# Patient Record
Sex: Female | Born: 1965
Health system: Southern US, Community
[De-identification: ages and names within clinical notes are randomized; demographics above are authoritative.]

## PROBLEM LIST (undated history)

## (undated) DIAGNOSIS — I10 Essential (primary) hypertension: Secondary | ICD-10-CM

## (undated) DIAGNOSIS — G43909 Migraine, unspecified, not intractable, without status migrainosus: Secondary | ICD-10-CM

## (undated) DIAGNOSIS — E079 Disorder of thyroid, unspecified: Secondary | ICD-10-CM

## (undated) HISTORY — PX: ABDOMINAL HYSTERECTOMY: SHX81

## (undated) HISTORY — PX: TUBAL LIGATION: SHX77

---

## 1999-10-08 ENCOUNTER — Other Ambulatory Visit: Admission: RE | Admit: 1999-10-08 | Discharge: 1999-10-08 | Payer: Self-pay | Admitting: Obstetrics and Gynecology

## 2002-08-19 ENCOUNTER — Emergency Department (HOSPITAL_COMMUNITY): Admission: EM | Admit: 2002-08-19 | Discharge: 2002-08-19 | Payer: Self-pay | Admitting: Emergency Medicine

## 2003-12-03 ENCOUNTER — Ambulatory Visit (HOSPITAL_COMMUNITY): Admission: RE | Admit: 2003-12-03 | Discharge: 2003-12-03 | Payer: Self-pay | Admitting: Obstetrics and Gynecology

## 2003-12-04 ENCOUNTER — Inpatient Hospital Stay (HOSPITAL_COMMUNITY): Admission: RE | Admit: 2003-12-04 | Discharge: 2003-12-06 | Payer: Self-pay | Admitting: Obstetrics and Gynecology

## 2003-12-04 ENCOUNTER — Encounter (INDEPENDENT_AMBULATORY_CARE_PROVIDER_SITE_OTHER): Payer: Self-pay | Admitting: Specialist

## 2010-05-30 ENCOUNTER — Emergency Department (HOSPITAL_COMMUNITY): Admission: EM | Admit: 2010-05-30 | Discharge: 2010-05-30 | Payer: Self-pay | Admitting: Emergency Medicine

## 2010-08-30 ENCOUNTER — Encounter: Payer: Self-pay | Admitting: Internal Medicine

## 2010-10-21 LAB — POCT URINALYSIS DIPSTICK
Glucose, UA: NEGATIVE mg/dL
Protein, ur: NEGATIVE mg/dL

## 2010-12-25 NOTE — Op Note (Signed)
NAME:  Janet Wolfe, Janet Wolfe                      ACCOUNT NO.:  [id number]   MEDICAL RECORD NO.:  [id number]                   PATIENT TYPE:  INP   LOCATION:  9304                                 FACILITY:  [facility]   PHYSICIAN:  Malachi Pro. Ambrose Mantle, M.D.              DATE OF BIRTH:  [birth year]   DATE OF PROCEDURE:  12/04/2003  DATE OF DISCHARGE:                                 OPERATIVE REPORT   PREOPERATIVE DIAGNOSES:  1. Menorrhagia and menometrorrhagia.  2. Fibroids.  3. Left ovarian cyst.   POSTOPERATIVE DIAGNOSES:  1. Menorrhagia and menometrorrhagia.  2. Fibroids.  3. Left ovarian cyst.   OPERATION:  1. Abdominal hysterectomy.  2. Incision of left ovarian cyst.   OPERATOR:  Malachi Pro. Ambrose Mantle, M.D.   ASSISTANT:  Zenaida Niece, M.D.   General anesthesia.   The patient was brought to the operating room, placed under satisfactory  general anesthesia.  She was placed in a frogleg position.  The abdomen was  prepped all the way to the xiphoid in case the midline incision was used and  needed to be extended.  The vulva and vagina and urethra were prepped and a  Foley catheter was inserted to straight drain.  Exam revealed the uterus to  be third degree retroverted, two times normal size, and the large left  ovarian cyst that had been palpable on the day prior to admission and been  confirmed by ultrasound showed regression, so I elected to do a transverse  incision.  The patient was placed supine, the abdomen was draped as a  sterile field, and a transverse incision was made suprapubically and carried  in layers through the skin, subcutaneous tissue, and fascia.  The fascia was  separated from the rectus muscle inferiorly and superiorly.  The midline was  identified, the peritoneum was opened, and the peritoneum was opened  vertically.  Exploration of the upper abdomen revealed the liver to be  smooth.  I did not feel the gallbladder.  Both kidneys felt normal.  Only  the lower  pole of the left kidney could be felt because the patient had  adhesions along the left paracolic gutter.  I do not know the origin of  these adhesions.  Inspection of the pelvis revealed the uterus to be two  times normal size with a large fibroid.  The uterus was markedly retroverted  but the cul-de-sac was free of disease.  Both tubes had a proximal segment  that had a small hydrosalpinx.  The distal segments of each tube looked  normal.  The right ovary was pretty much normal in appearance.  The left  ovary was still enlarged but had regressed markedly since its exam  yesterday.  There was no evidence of malignancy.  I incised the left ovarian  cyst.  It completely collapsed.  I felt the inside of the wall.  There was  no evidence of any abnormality on  the wall of the cyst.  This probably was  just a simple serous cyst of the ovary.  Packs and retractors were used to  prepare the operative field.  The upper pedicles of the uterus were clamped  across.  The round ligaments bilaterally were divided with the electrical  current.  The bladder flap was developed.  The upper pedicles were then  divided between clamps and doubly suture ligated.  The proximal portion of  each tube was carried with the uterus.  The parametrial tissues and uterine  vessels were clamped, cut, and suture ligated.  Parametrial and paracervical  tissues were handled in the same fashion.  The uterosacral ligaments were  clamped, cut, and suture ligated and held.  The left vaginal angle was  entered and then the right vaginal angle was entered.  The uterus was  removed by transecting the upper vagina.  Several interrupted figure-of-  eight sutures of 0 Vicryl were used to close the vagina after vaginal angle  sutures had been placed.  Liberal irrigation confirmed hemostasis.  The  uterosacral ligaments were sutured together in the midline.  While I was  trying to identify the left ureter, I think I may have dislodged a  suture  from the left ovary, and I had to suture this area with 3-0 Vicryl with good  hemostasis.  Liberal irrigation again confirmed hemostasis.  Both ureters  were palpated and were normal in their contour and size.  There was no  bleeding.  Reperitonealization was done across the vaginal cuff with  interrupted figure-of-eight sutures of 0 Vicryl.  Packs and retractors were  removed.  The peritoneum was closed with a running suture of 0 Vicryl,  rectus muscle with interrupted 0 Vicryl, fascia with two running sutures of  0 Vicryl, subcu with a running 3-0 Vicryl, and the skin was closed with  automatic staples.  The patient seemed to tolerate the procedure well.  Blood loss was estimated at no more than 200 mL.  Sponge and needle counts  were correct, and the patient was returned to recovery in satisfactory  condition.                                               Malachi Pro. Ambrose Mantle, M.D.    TFH/MEDQ  D:  12/04/2003  T:  12/04/2003  Job:  [id number]

## 2010-12-25 NOTE — H&P (Signed)
NAME:  Janet Wolfe, Janet Wolfe                      ACCOUNT NO.:  [id number]   MEDICAL RECORD NO.:  [id number]                   PATIENT TYPE:  INP   LOCATION:  NA                                   FACILITY:  [facility]   PHYSICIAN:  Malachi Pro. Ambrose Mantle, M.D.              DATE OF BIRTH:  07-23-1966   DATE OF ADMISSION:  12/04/2003  DATE OF DISCHARGE:                                HISTORY & PHYSICAL   PRESENT ILLNESS:  This is a 45 year old white female para 3-0-0-3 who was  admitted to the hospital for abdominal hysterectomy and possible bilateral  salpingo-oophorectomy because of severe menorrhagia, uterine enlargement and  bilateral adnexal masses.  This patient's last menstrual period began December 02, 2003; previous period was October 28, 2003, lasted for 10-14 days, she  passed egg-size clots; her previous period was September 30, 2003, lasted 14  days, egg-size clots.  She had no pain with her periods.  She has been  having no sexual activity.  She states that the last four to five cycles her  periods have been extremely heavy and prolonged.  Before that her periods  were always normal.  An endometrial biopsy on November 15, 2003 showed simple  endometrial hyperplasia without atypia, the uterus was about two times  normal size and she was advised to have hysterectomy.  She actually had come  asking for a hysterectomy.  On her preop exam December 03, 2003 the patient was  noted to have bilateral adnexal masses and an ultrasound showed bilateral  ovarian masses, complex cysts on both sides (left greater than right) and  there is a question of free fluid in the pelvis.  She also was noted to have  fibroids in the uterus.   PAST MEDICAL HISTORY:  1. Allergy to CODEINE in that it causes nausea and vomiting.  2. Medications:     a. Wellbutrin twice a day.     b. Amitriptyline.     c. Diovan/HCT.  3. Operations:  Tubal ligation.  4. No significant illnesses other than the high blood pressure.  No heart   problems.  No alcohol or tobacco.   REVIEW OF SYSTEMS:  Essentially negative except as in the present illness  plus migraines.   FAMILY HISTORY:  Mother 28 living and well.  Father 54 with high blood  pressure.  The patient has one sister with high blood pressure.  No  brothers.   SOCIAL HISTORY:  The patient works at [employer].  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   PHYSICAL EXAMINATION:  GENERAL:  Well-developed, well-nourished white female  182 pounds, blood pressure 130/94, pulse 82.  HEAD/EYES/EARS/NOSE AND THROAT:  No cranial abnormalities.  Extraocular  movements intact.  Nose and pharynx clear.  NECK:  Supple without thyromegaly.  HEART:  Normal size and sounds.  No murmurs.  LUNGS:  Clear to P&A.  BREASTS:  Soft, nontender.  No masses are palpable.  ABDOMEN:  Soft and nontender.  Liver, spleen and kidney are not felt.  No  masses.  GU:  Vulva and vagina heavy menstrual flow, the cervix is behind the  symphysis pubis, the uterus is third degree retroverted two times normal  size, bilateral adnexal masses are palpable.   ADMITTING IMPRESSION:  1. Menorrhagia.  2. Menometrorrhagia.  3. Leiomyomata uteri.  4. Bilateral adnexal masses.   PLAN:  Patient is admitted for abdominal hysterectomy, possible bilateral  salpingo-oophorectomy.  I have asked a general surgeon to stand by in case  the tumor is malignant.  The patient understands that it is possible that  both tubes and ovaries will need to be removed but if the tissue is benign  and it is possible to save one or both ovaries that is my plan.  She has  been cautioned about the risks of surgery including but not limited to heart  attack, stroke, pulmonary embolus, wound disruption, hemorrhage with need  for reoperation and/or transfusion, fistula formation, nerve injury, and  intestinal obstruction.  She understands and agrees to  proceed.                                               Malachi Pro. Ambrose Mantle, M.D.    TFH/MEDQ  D:  12/03/2003  T:  12/03/2003  Job:  [id number]

## 2010-12-25 NOTE — Discharge Summary (Signed)
NAME:  Janet Wolfe, Janet Wolfe                      ACCOUNT NO.:  [id number]   MEDICAL RECORD NO.:  [id number]                   PATIENT TYPE:  INP   LOCATION:  9304                                 FACILITY:  [facility]   PHYSICIAN:  Malachi Pro. Ambrose Mantle, M.D.              DATE OF BIRTH:  04-02-1966   DATE OF ADMISSION:  12/04/2003  DATE OF DISCHARGE:                                 DISCHARGE SUMMARY   HOSPITAL COURSE:  A 45 year old white female with menorrhagia,  metromenorrhagia, fibroids, and a left ovarian cyst admitted for abdominal  hysterectomy and possible bilateral salpingo-oophorectomy.  The patient  underwent abdominal hysterectomy and incision of the left ovarian cyst on  December 04, 2003.  There was no sign of malignancy.  The cyst appeared to be  somewhat simple in spite of the fact that the day prior to surgery the cyst  had appeared larger on ultrasound and actually appeared complex.  The  patient did well postoperatively and was discharged on postoperative day #2.  Her abdomen remained soft and nontender.  She tolerated a liquid diet  without difficulty, ambulated well, voided well, and was ready for discharge  on postoperative day #2.  Comprehensive metabolic profile was normal except  for a sodium of 134.  White count of 10,900; hemoglobin 12.4; hematocrit  37.1; platelet count 309,000; normal differential.  The patient was O  positive with a negative antibody.  Urinalysis was negative.  Follow-up  hematocrits were 32.9 and 29.8 and CA125 was 19.0.   FINAL DIAGNOSES WITH THE PATHOLOGY PENDING:  1. Menorrhagia and metromenorrhagia.  2. Leiomyomata uteri.  3. Simple left ovarian cyst.   OPERATION:  Abdominal hysterectomy, incision of left ovarian cyst.   FINAL CONDITION:  Improved.   INSTRUCTIONS:  Include our regular discharge instruction booklet, no vaginal  entrance, no heavy lifting or strenuous activity.  Call with any temperature  elevation greater than 100.4 degrees.  Call  with any heavy vaginal bleeding  or with any unusual problems.  The patient's incision is healing well,  staples are left in place, and she is advised to return to the office in 4  days to have her staples removed.  Mepergan Fortis #24 tablets one or two  q.4-6h. as needed for pain is given at discharge.                                               Malachi Pro. Ambrose Mantle, M.D.    TFH/MEDQ  D:  12/06/2003  T:  12/06/2003  Job:  [id number]

## 2011-05-31 ENCOUNTER — Other Ambulatory Visit: Payer: Self-pay | Admitting: Internal Medicine

## 2011-05-31 DIAGNOSIS — Z1231 Encounter for screening mammogram for malignant neoplasm of breast: Secondary | ICD-10-CM

## 2011-06-23 ENCOUNTER — Ambulatory Visit
Admission: RE | Admit: 2011-06-23 | Discharge: 2011-06-23 | Disposition: A | Discharge status: Home / Self Care | Payer: BC Managed Care – PPO | Source: Ambulatory Visit | Attending: Internal Medicine | Admitting: Internal Medicine

## 2011-06-23 DIAGNOSIS — Z1231 Encounter for screening mammogram for malignant neoplasm of breast: Secondary | ICD-10-CM

## 2011-08-19 ENCOUNTER — Other Ambulatory Visit: Payer: Self-pay | Admitting: Internal Medicine

## 2011-08-19 DIAGNOSIS — R945 Abnormal results of liver function studies: Secondary | ICD-10-CM

## 2011-10-26 ENCOUNTER — Other Ambulatory Visit: Payer: BC Managed Care – PPO

## 2011-10-29 ENCOUNTER — Other Ambulatory Visit: Payer: Self-pay | Admitting: Dermatology

## 2012-05-10 ENCOUNTER — Other Ambulatory Visit: Payer: Self-pay | Admitting: Internal Medicine

## 2012-10-24 ENCOUNTER — Other Ambulatory Visit: Payer: Self-pay

## 2012-10-24 DIAGNOSIS — Z1231 Encounter for screening mammogram for malignant neoplasm of breast: Secondary | ICD-10-CM

## 2012-11-24 ENCOUNTER — Ambulatory Visit
Admission: RE | Admit: 2012-11-24 | Discharge: 2012-11-24 | Disposition: A | Discharge status: Home / Self Care | Payer: No Typology Code available for payment source | Source: Ambulatory Visit

## 2012-11-24 ENCOUNTER — Ambulatory Visit: Payer: BC Managed Care – PPO

## 2012-11-24 DIAGNOSIS — Z1231 Encounter for screening mammogram for malignant neoplasm of breast: Secondary | ICD-10-CM

## 2013-10-22 ENCOUNTER — Other Ambulatory Visit: Payer: Self-pay

## 2013-10-22 DIAGNOSIS — Z1231 Encounter for screening mammogram for malignant neoplasm of breast: Secondary | ICD-10-CM

## 2013-11-15 ENCOUNTER — Other Ambulatory Visit: Payer: Self-pay | Admitting: Internal Medicine

## 2013-11-15 DIAGNOSIS — R7401 Elevation of levels of liver transaminase levels: Secondary | ICD-10-CM

## 2013-11-15 DIAGNOSIS — R740 Nonspecific elevation of levels of transaminase and lactic acid dehydrogenase [LDH]: Principal | ICD-10-CM

## 2013-11-19 ENCOUNTER — Ambulatory Visit
Admission: RE | Admit: 2013-11-19 | Discharge: 2013-11-19 | Disposition: A | Discharge status: Home / Self Care | Payer: No Typology Code available for payment source | Source: Ambulatory Visit | Attending: Internal Medicine | Admitting: Internal Medicine

## 2013-11-19 DIAGNOSIS — R740 Nonspecific elevation of levels of transaminase and lactic acid dehydrogenase [LDH]: Principal | ICD-10-CM

## 2013-11-19 DIAGNOSIS — R7401 Elevation of levels of liver transaminase levels: Secondary | ICD-10-CM

## 2013-11-27 ENCOUNTER — Ambulatory Visit: Payer: No Typology Code available for payment source

## 2013-11-30 ENCOUNTER — Ambulatory Visit
Admission: RE | Admit: 2013-11-30 | Discharge: 2013-11-30 | Disposition: A | Discharge status: Home / Self Care | Payer: No Typology Code available for payment source | Source: Ambulatory Visit

## 2013-11-30 ENCOUNTER — Encounter (INDEPENDENT_AMBULATORY_CARE_PROVIDER_SITE_OTHER): Payer: Self-pay

## 2013-11-30 DIAGNOSIS — Z1231 Encounter for screening mammogram for malignant neoplasm of breast: Secondary | ICD-10-CM

## 2014-01-08 ENCOUNTER — Other Ambulatory Visit: Payer: Self-pay | Admitting: Dermatology

## 2014-05-06 ENCOUNTER — Other Ambulatory Visit: Payer: Self-pay | Admitting: Internal Medicine

## 2014-05-06 DIAGNOSIS — N281 Cyst of kidney, acquired: Secondary | ICD-10-CM

## 2014-10-28 ENCOUNTER — Other Ambulatory Visit: Payer: Self-pay

## 2014-10-28 DIAGNOSIS — Z1239 Encounter for other screening for malignant neoplasm of breast: Secondary | ICD-10-CM

## 2014-11-14 ENCOUNTER — Other Ambulatory Visit: Payer: Self-pay | Admitting: Internal Medicine

## 2014-11-14 DIAGNOSIS — R945 Abnormal results of liver function studies: Secondary | ICD-10-CM

## 2014-11-21 ENCOUNTER — Ambulatory Visit
Admission: RE | Admit: 2014-11-21 | Discharge: 2014-11-21 | Disposition: A | Discharge status: Home / Self Care | Payer: BLUE CROSS/BLUE SHIELD | Source: Ambulatory Visit | Attending: Internal Medicine | Admitting: Internal Medicine

## 2014-11-21 DIAGNOSIS — R945 Abnormal results of liver function studies: Secondary | ICD-10-CM

## 2014-12-13 ENCOUNTER — Ambulatory Visit: Payer: BLUE CROSS/BLUE SHIELD

## 2014-12-13 ENCOUNTER — Ambulatory Visit: Payer: No Typology Code available for payment source

## 2014-12-18 ENCOUNTER — Ambulatory Visit
Admission: RE | Admit: 2014-12-18 | Discharge: 2014-12-18 | Disposition: A | Discharge status: Home / Self Care | Payer: BLUE CROSS/BLUE SHIELD | Source: Ambulatory Visit

## 2014-12-18 DIAGNOSIS — Z1239 Encounter for other screening for malignant neoplasm of breast: Secondary | ICD-10-CM

## 2015-11-13 ENCOUNTER — Other Ambulatory Visit: Payer: Self-pay

## 2015-11-13 DIAGNOSIS — Z1231 Encounter for screening mammogram for malignant neoplasm of breast: Secondary | ICD-10-CM

## 2016-01-20 ENCOUNTER — Ambulatory Visit: Payer: BLUE CROSS/BLUE SHIELD

## 2016-01-20 DIAGNOSIS — K580 Irritable bowel syndrome with diarrhea: Secondary | ICD-10-CM | POA: Diagnosis not present

## 2016-01-20 DIAGNOSIS — R110 Nausea: Secondary | ICD-10-CM | POA: Diagnosis not present

## 2016-01-20 DIAGNOSIS — E669 Obesity, unspecified: Secondary | ICD-10-CM | POA: Diagnosis not present

## 2016-01-26 ENCOUNTER — Ambulatory Visit
Admission: RE | Admit: 2016-01-26 | Discharge: 2016-01-26 | Disposition: A | Discharge status: Home / Self Care | Payer: BLUE CROSS/BLUE SHIELD | Source: Ambulatory Visit

## 2016-01-26 DIAGNOSIS — Z1231 Encounter for screening mammogram for malignant neoplasm of breast: Secondary | ICD-10-CM | POA: Diagnosis not present

## 2016-01-29 ENCOUNTER — Other Ambulatory Visit: Payer: Self-pay | Admitting: Internal Medicine

## 2016-01-29 DIAGNOSIS — R928 Other abnormal and inconclusive findings on diagnostic imaging of breast: Secondary | ICD-10-CM

## 2016-02-05 ENCOUNTER — Ambulatory Visit
Admission: RE | Admit: 2016-02-05 | Discharge: 2016-02-05 | Disposition: A | Discharge status: Home / Self Care | Payer: BLUE CROSS/BLUE SHIELD | Source: Ambulatory Visit | Attending: Internal Medicine | Admitting: Internal Medicine

## 2016-02-05 DIAGNOSIS — R928 Other abnormal and inconclusive findings on diagnostic imaging of breast: Secondary | ICD-10-CM | POA: Diagnosis not present

## 2016-03-17 DIAGNOSIS — Z0001 Encounter for general adult medical examination with abnormal findings: Secondary | ICD-10-CM | POA: Diagnosis not present

## 2016-03-17 DIAGNOSIS — Z13220 Encounter for screening for lipoid disorders: Secondary | ICD-10-CM | POA: Diagnosis not present

## 2016-03-17 DIAGNOSIS — Z0000 Encounter for general adult medical examination without abnormal findings: Secondary | ICD-10-CM | POA: Diagnosis not present

## 2016-03-24 DIAGNOSIS — R739 Hyperglycemia, unspecified: Secondary | ICD-10-CM | POA: Diagnosis not present

## 2016-03-24 DIAGNOSIS — E7800 Pure hypercholesterolemia, unspecified: Secondary | ICD-10-CM | POA: Diagnosis not present

## 2016-03-24 DIAGNOSIS — E039 Hypothyroidism, unspecified: Secondary | ICD-10-CM | POA: Diagnosis not present

## 2016-03-24 DIAGNOSIS — I10 Essential (primary) hypertension: Secondary | ICD-10-CM | POA: Diagnosis not present

## 2016-05-17 DIAGNOSIS — Z23 Encounter for immunization: Secondary | ICD-10-CM | POA: Diagnosis not present

## 2016-06-22 DIAGNOSIS — R739 Hyperglycemia, unspecified: Secondary | ICD-10-CM | POA: Diagnosis not present

## 2016-06-22 DIAGNOSIS — E039 Hypothyroidism, unspecified: Secondary | ICD-10-CM | POA: Diagnosis not present

## 2016-06-22 DIAGNOSIS — I10 Essential (primary) hypertension: Secondary | ICD-10-CM | POA: Diagnosis not present

## 2016-07-12 DIAGNOSIS — L918 Other hypertrophic disorders of the skin: Secondary | ICD-10-CM | POA: Diagnosis not present

## 2016-07-12 DIAGNOSIS — Z85828 Personal history of other malignant neoplasm of skin: Secondary | ICD-10-CM | POA: Diagnosis not present

## 2016-07-12 DIAGNOSIS — D2261 Melanocytic nevi of right upper limb, including shoulder: Secondary | ICD-10-CM | POA: Diagnosis not present

## 2016-07-12 DIAGNOSIS — D2262 Melanocytic nevi of left upper limb, including shoulder: Secondary | ICD-10-CM | POA: Diagnosis not present

## 2016-07-26 DIAGNOSIS — Z6833 Body mass index (BMI) 33.0-33.9, adult: Secondary | ICD-10-CM | POA: Diagnosis not present

## 2016-07-26 DIAGNOSIS — Z01419 Encounter for gynecological examination (general) (routine) without abnormal findings: Secondary | ICD-10-CM | POA: Diagnosis not present

## 2016-07-26 DIAGNOSIS — Z1151 Encounter for screening for human papillomavirus (HPV): Secondary | ICD-10-CM | POA: Diagnosis not present

## 2016-07-26 DIAGNOSIS — N390 Urinary tract infection, site not specified: Secondary | ICD-10-CM | POA: Diagnosis not present

## 2016-07-26 DIAGNOSIS — N3020 Other chronic cystitis without hematuria: Secondary | ICD-10-CM | POA: Diagnosis not present

## 2016-08-16 DIAGNOSIS — B070 Plantar wart: Secondary | ICD-10-CM | POA: Diagnosis not present

## 2016-08-16 DIAGNOSIS — F320 Major depressive disorder, single episode, mild: Secondary | ICD-10-CM | POA: Diagnosis not present

## 2016-08-16 DIAGNOSIS — I10 Essential (primary) hypertension: Secondary | ICD-10-CM | POA: Diagnosis not present

## 2016-09-20 DIAGNOSIS — R739 Hyperglycemia, unspecified: Secondary | ICD-10-CM | POA: Diagnosis not present

## 2016-09-20 DIAGNOSIS — I10 Essential (primary) hypertension: Secondary | ICD-10-CM | POA: Diagnosis not present

## 2016-09-27 DIAGNOSIS — F419 Anxiety disorder, unspecified: Secondary | ICD-10-CM | POA: Diagnosis not present

## 2016-09-27 DIAGNOSIS — E039 Hypothyroidism, unspecified: Secondary | ICD-10-CM | POA: Diagnosis not present

## 2016-09-27 DIAGNOSIS — I10 Essential (primary) hypertension: Secondary | ICD-10-CM | POA: Diagnosis not present

## 2016-09-27 DIAGNOSIS — R739 Hyperglycemia, unspecified: Secondary | ICD-10-CM | POA: Diagnosis not present

## 2017-02-10 ENCOUNTER — Other Ambulatory Visit: Payer: Self-pay | Admitting: Internal Medicine

## 2017-02-10 DIAGNOSIS — Z1231 Encounter for screening mammogram for malignant neoplasm of breast: Secondary | ICD-10-CM

## 2017-02-21 ENCOUNTER — Ambulatory Visit
Admission: RE | Admit: 2017-02-21 | Discharge: 2017-02-21 | Disposition: A | Discharge status: Home / Self Care | Payer: BLUE CROSS/BLUE SHIELD | Source: Ambulatory Visit | Attending: Internal Medicine | Admitting: Internal Medicine

## 2017-02-21 DIAGNOSIS — Z1231 Encounter for screening mammogram for malignant neoplasm of breast: Secondary | ICD-10-CM

## 2017-05-09 DIAGNOSIS — E039 Hypothyroidism, unspecified: Secondary | ICD-10-CM | POA: Diagnosis not present

## 2017-05-09 DIAGNOSIS — R739 Hyperglycemia, unspecified: Secondary | ICD-10-CM | POA: Diagnosis not present

## 2017-05-09 DIAGNOSIS — K7689 Other specified diseases of liver: Secondary | ICD-10-CM | POA: Diagnosis not present

## 2017-05-09 DIAGNOSIS — I10 Essential (primary) hypertension: Secondary | ICD-10-CM | POA: Diagnosis not present

## 2017-05-09 DIAGNOSIS — Z5181 Encounter for therapeutic drug level monitoring: Secondary | ICD-10-CM | POA: Diagnosis not present

## 2017-06-03 DIAGNOSIS — Z23 Encounter for immunization: Secondary | ICD-10-CM | POA: Diagnosis not present

## 2017-06-03 DIAGNOSIS — R739 Hyperglycemia, unspecified: Secondary | ICD-10-CM | POA: Diagnosis not present

## 2017-06-03 DIAGNOSIS — Z0000 Encounter for general adult medical examination without abnormal findings: Secondary | ICD-10-CM | POA: Diagnosis not present

## 2017-07-07 DIAGNOSIS — Z86010 Personal history of colonic polyps: Secondary | ICD-10-CM | POA: Diagnosis not present

## 2017-07-07 DIAGNOSIS — E669 Obesity, unspecified: Secondary | ICD-10-CM | POA: Diagnosis not present

## 2017-07-07 DIAGNOSIS — K580 Irritable bowel syndrome with diarrhea: Secondary | ICD-10-CM | POA: Diagnosis not present

## 2017-07-15 DIAGNOSIS — D2262 Melanocytic nevi of left upper limb, including shoulder: Secondary | ICD-10-CM | POA: Diagnosis not present

## 2017-07-15 DIAGNOSIS — Z85828 Personal history of other malignant neoplasm of skin: Secondary | ICD-10-CM | POA: Diagnosis not present

## 2017-07-15 DIAGNOSIS — D2261 Melanocytic nevi of right upper limb, including shoulder: Secondary | ICD-10-CM | POA: Diagnosis not present

## 2017-07-15 DIAGNOSIS — L821 Other seborrheic keratosis: Secondary | ICD-10-CM | POA: Diagnosis not present

## 2017-10-04 DIAGNOSIS — R197 Diarrhea, unspecified: Secondary | ICD-10-CM | POA: Diagnosis not present

## 2017-11-07 DIAGNOSIS — R739 Hyperglycemia, unspecified: Secondary | ICD-10-CM | POA: Diagnosis not present

## 2017-11-07 DIAGNOSIS — E7800 Pure hypercholesterolemia, unspecified: Secondary | ICD-10-CM | POA: Diagnosis not present

## 2017-11-14 DIAGNOSIS — E7800 Pure hypercholesterolemia, unspecified: Secondary | ICD-10-CM | POA: Diagnosis not present

## 2017-11-14 DIAGNOSIS — F5101 Primary insomnia: Secondary | ICD-10-CM | POA: Diagnosis not present

## 2017-12-14 DIAGNOSIS — G4700 Insomnia, unspecified: Secondary | ICD-10-CM | POA: Diagnosis not present

## 2018-01-18 DIAGNOSIS — E039 Hypothyroidism, unspecified: Secondary | ICD-10-CM | POA: Diagnosis not present

## 2018-01-18 DIAGNOSIS — I10 Essential (primary) hypertension: Secondary | ICD-10-CM | POA: Diagnosis not present

## 2018-01-18 DIAGNOSIS — G4700 Insomnia, unspecified: Secondary | ICD-10-CM | POA: Diagnosis not present

## 2018-01-18 DIAGNOSIS — F419 Anxiety disorder, unspecified: Secondary | ICD-10-CM | POA: Diagnosis not present

## 2018-01-19 ENCOUNTER — Other Ambulatory Visit: Payer: Self-pay | Admitting: Internal Medicine

## 2018-01-19 DIAGNOSIS — Z1231 Encounter for screening mammogram for malignant neoplasm of breast: Secondary | ICD-10-CM

## 2018-02-23 DIAGNOSIS — G43709 Chronic migraine without aura, not intractable, without status migrainosus: Secondary | ICD-10-CM | POA: Diagnosis not present

## 2018-03-15 ENCOUNTER — Other Ambulatory Visit: Payer: Self-pay | Admitting: Internal Medicine

## 2018-03-15 ENCOUNTER — Ambulatory Visit
Admission: RE | Admit: 2018-03-15 | Discharge: 2018-03-15 | Disposition: A | Discharge status: Home / Self Care | Payer: BLUE CROSS/BLUE SHIELD | Source: Ambulatory Visit | Attending: Internal Medicine | Admitting: Internal Medicine

## 2018-03-15 DIAGNOSIS — Z1231 Encounter for screening mammogram for malignant neoplasm of breast: Secondary | ICD-10-CM | POA: Diagnosis not present

## 2018-05-01 DIAGNOSIS — Z79899 Other long term (current) drug therapy: Secondary | ICD-10-CM | POA: Diagnosis not present

## 2018-07-21 DIAGNOSIS — L7211 Pilar cyst: Secondary | ICD-10-CM | POA: Diagnosis not present

## 2018-07-21 DIAGNOSIS — D2221 Melanocytic nevi of right ear and external auricular canal: Secondary | ICD-10-CM | POA: Diagnosis not present

## 2018-07-21 DIAGNOSIS — D2262 Melanocytic nevi of left upper limb, including shoulder: Secondary | ICD-10-CM | POA: Diagnosis not present

## 2018-07-21 DIAGNOSIS — Z85828 Personal history of other malignant neoplasm of skin: Secondary | ICD-10-CM | POA: Diagnosis not present

## 2018-07-24 DIAGNOSIS — Z79899 Other long term (current) drug therapy: Secondary | ICD-10-CM | POA: Diagnosis not present

## 2018-07-24 DIAGNOSIS — I10 Essential (primary) hypertension: Secondary | ICD-10-CM | POA: Diagnosis not present

## 2018-07-24 DIAGNOSIS — E039 Hypothyroidism, unspecified: Secondary | ICD-10-CM | POA: Diagnosis not present

## 2018-07-24 DIAGNOSIS — N390 Urinary tract infection, site not specified: Secondary | ICD-10-CM | POA: Diagnosis not present

## 2018-07-31 DIAGNOSIS — E7800 Pure hypercholesterolemia, unspecified: Secondary | ICD-10-CM | POA: Diagnosis not present

## 2018-07-31 DIAGNOSIS — Z0000 Encounter for general adult medical examination without abnormal findings: Secondary | ICD-10-CM | POA: Diagnosis not present

## 2018-07-31 DIAGNOSIS — G43909 Migraine, unspecified, not intractable, without status migrainosus: Secondary | ICD-10-CM | POA: Diagnosis not present

## 2018-07-31 DIAGNOSIS — I10 Essential (primary) hypertension: Secondary | ICD-10-CM | POA: Diagnosis not present

## 2018-07-31 DIAGNOSIS — Z23 Encounter for immunization: Secondary | ICD-10-CM | POA: Diagnosis not present

## 2018-08-31 DIAGNOSIS — K580 Irritable bowel syndrome with diarrhea: Secondary | ICD-10-CM | POA: Diagnosis not present

## 2018-08-31 DIAGNOSIS — Z86010 Personal history of colonic polyps: Secondary | ICD-10-CM | POA: Diagnosis not present

## 2018-11-21 DIAGNOSIS — F419 Anxiety disorder, unspecified: Secondary | ICD-10-CM | POA: Diagnosis not present

## 2018-11-21 DIAGNOSIS — G4700 Insomnia, unspecified: Secondary | ICD-10-CM | POA: Diagnosis not present

## 2018-11-21 DIAGNOSIS — F329 Major depressive disorder, single episode, unspecified: Secondary | ICD-10-CM | POA: Diagnosis not present

## 2018-11-21 DIAGNOSIS — I10 Essential (primary) hypertension: Secondary | ICD-10-CM | POA: Diagnosis not present

## 2018-12-19 DIAGNOSIS — F329 Major depressive disorder, single episode, unspecified: Secondary | ICD-10-CM | POA: Diagnosis not present

## 2018-12-19 DIAGNOSIS — I10 Essential (primary) hypertension: Secondary | ICD-10-CM | POA: Diagnosis not present

## 2019-02-05 DIAGNOSIS — G43909 Migraine, unspecified, not intractable, without status migrainosus: Secondary | ICD-10-CM | POA: Diagnosis not present

## 2019-02-05 DIAGNOSIS — F329 Major depressive disorder, single episode, unspecified: Secondary | ICD-10-CM | POA: Diagnosis not present

## 2019-02-05 DIAGNOSIS — I10 Essential (primary) hypertension: Secondary | ICD-10-CM | POA: Diagnosis not present

## 2019-02-06 ENCOUNTER — Other Ambulatory Visit: Payer: Self-pay | Admitting: Internal Medicine

## 2019-02-06 DIAGNOSIS — Z1231 Encounter for screening mammogram for malignant neoplasm of breast: Secondary | ICD-10-CM

## 2019-02-11 ENCOUNTER — Encounter (HOSPITAL_COMMUNITY): Payer: Self-pay

## 2019-02-11 ENCOUNTER — Other Ambulatory Visit: Payer: Self-pay

## 2019-02-11 ENCOUNTER — Emergency Department (HOSPITAL_COMMUNITY)
Admission: EM | Admit: 2019-02-11 | Discharge: 2019-02-11 | Disposition: A | Discharge status: Home / Self Care | Payer: BC Managed Care – PPO | Attending: Emergency Medicine | Admitting: Emergency Medicine

## 2019-02-11 DIAGNOSIS — F17220 Nicotine dependence, chewing tobacco, uncomplicated: Secondary | ICD-10-CM | POA: Insufficient documentation

## 2019-02-11 DIAGNOSIS — T148XXA Other injury of unspecified body region, initial encounter: Secondary | ICD-10-CM

## 2019-02-11 DIAGNOSIS — M545 Low back pain, unspecified: Secondary | ICD-10-CM

## 2019-02-11 DIAGNOSIS — R1032 Left lower quadrant pain: Secondary | ICD-10-CM | POA: Diagnosis not present

## 2019-02-11 DIAGNOSIS — I10 Essential (primary) hypertension: Secondary | ICD-10-CM | POA: Insufficient documentation

## 2019-02-11 DIAGNOSIS — S39012A Strain of muscle, fascia and tendon of lower back, initial encounter: Secondary | ICD-10-CM | POA: Diagnosis not present

## 2019-02-11 HISTORY — DX: Disorder of thyroid, unspecified: E07.9

## 2019-02-11 HISTORY — DX: Essential (primary) hypertension: I10

## 2019-02-11 HISTORY — DX: Migraine, unspecified, not intractable, without status migrainosus: G43.909

## 2019-02-11 LAB — URINALYSIS, COMPLETE (UACMP) WITH MICROSCOPIC
Bilirubin Urine: NEGATIVE
Glucose, UA: NEGATIVE mg/dL
Ketones, ur: NEGATIVE mg/dL
Nitrite: NEGATIVE
Protein, ur: NEGATIVE mg/dL
Specific Gravity, Urine: 1.023 (ref 1.005–1.030)
pH: 5 (ref 5.0–8.0)

## 2019-02-11 LAB — POC URINE PREG, ED: Preg Test, Ur: NEGATIVE

## 2019-02-11 MED ORDER — METHOCARBAMOL 500 MG PO TABS: 500.0000 mg | ORAL_TABLET | Freq: Two times a day (BID) | ORAL | 0 refills | Status: AC

## 2019-02-11 MED ORDER — NAPROXEN 375 MG PO TABS: 375.0000 mg | ORAL_TABLET | Freq: Two times a day (BID) | ORAL | 0 refills | Status: AC

## 2019-02-11 MED ORDER — LIDOCAINE 5 % EX PTCH: 1.0000 | MEDICATED_PATCH | CUTANEOUS | 0 refills | Status: AC

## 2019-02-11 MED ORDER — NAPROXEN 375 MG PO TABS
375.0000 mg | ORAL_TABLET | Freq: Once | ORAL | Status: AC
  Administered 2019-02-11: 18:00:00 375 mg via ORAL
  Filled 2019-02-11: qty 1

## 2019-02-11 MED ORDER — LIDOCAINE 5 % EX PTCH
1.0000 | MEDICATED_PATCH | CUTANEOUS | Status: DC
  Administered 2019-02-11: 1 via TRANSDERMAL
  Filled 2019-02-11: qty 1

## 2019-02-11 MED ORDER — METHOCARBAMOL 500 MG PO TABS
500.0000 mg | ORAL_TABLET | Freq: Once | ORAL | Status: AC
  Administered 2019-02-11: 500 mg via ORAL
  Filled 2019-02-11: qty 1

## 2019-02-11 NOTE — ED Triage Notes (Signed)
Pt c/o left lower back pain. Pt states when she bent over this morning, she started having pain. Pt stated that she can't get get into a comfortable position. No relief with advil PM.

## 2019-02-11 NOTE — ED Provider Notes (Addendum)
[facility]-EMERGENCY DEPT Provider Note   CSN: [id number] Arrival date & time: 02/11/19  1524    History   Chief Complaint Chief Complaint  Patient presents with  . Back Pain    HPI Janet BlazingMarilyn C Marner is a 53 y.o. female presented today with left lower back pain that began this morning after she leaned over to pick up her close after taking a shower.  Patient reports that when leaning over she had a sudden sharp sensation around her left gluteus medius muscle that has remained constant since onset she has taken 1 Advil without relief, moderate in intensity without radiation.  Patient reports that pain is worsened with palpation as well as leaning to the right side.  She denies any fall or injury.  Patient does report that she was lifting water buckets yesterday and is unsure if this has anything to do with her pain today.  Patient denies fever/chills, vomiting, diarrhea, dysuria/hematuria, vaginal bleeding/discharge, saddle area paresthesias, bowel/bladder incontinence, urinary retention, IV drug use, history of cancer, fall/injury or any additional concerns today.     HPI  Past Medical History:  Diagnosis Date  . Hypertension   . Migraine   . Thyroid disease     There are no active problems to display for this patient.   Past Surgical History:  Procedure Laterality Date  . ABDOMINAL HYSTERECTOMY       OB History   No obstetric history on file.      Home Medications    Prior to Admission medications   Medication Sig Start Date End Date Taking? Authorizing Provider  lidocaine (LIDODERM) 5 % Place 1 patch onto the skin daily. Remove & Discard patch within 12 hours or as directed by MD 02/11/19   Bill SalinasMorelli, Haydyn Girvan A, PA-C  methocarbamol (ROBAXIN) 500 MG tablet Take 1 tablet (500 mg total) by mouth 2 (two) times daily for 7 days. 02/11/19 02/18/19  Harlene SaltsMorelli, Jihan Mellette A, PA-C  naproxen (NAPROSYN) 375 MG tablet Take 1 tablet (375 mg total) by mouth 2 (two)  times daily for 7 days. 02/11/19 02/18/19  Bill SalinasMorelli, Fallynn Gravett A, PA-C    Family History Family History  Problem Relation Age of Onset  . Breast cancer Maternal Grandmother     Social History Social History   Tobacco Use  . Smoking status: Former Games developermoker  . Smokeless tobacco: Current User  Substance Use Topics  . Alcohol use: Never    Frequency: Never  . Drug use: Never     Allergies   Patient has no known allergies.   Review of Systems Review of Systems  Constitutional: Negative.  Negative for chills and fever.  Respiratory: Negative.  Negative for cough and shortness of breath.   Cardiovascular: Negative.  Negative for chest pain.  Gastrointestinal: Negative.  Negative for abdominal pain, blood in stool, constipation, diarrhea and vomiting.  Genitourinary: Negative.  Negative for difficulty urinating, dysuria, frequency, hematuria, pelvic pain, vaginal bleeding and vaginal discharge.  Musculoskeletal: Positive for back pain. Negative for neck pain.  Neurological: Negative.  Negative for weakness, numbness and headaches.       Denies saddle area paresthesias Denies bowel/bladder incontinence Denies urinary retention   Physical Exam Updated Vital Signs BP (!) 146/85 (BP Location: Left Arm)   Pulse 75   Temp 99 F (37.2 C) (Oral)   Resp 16   Ht 5\' 4"  (1.626 m)   Wt 72.6 kg   SpO2 100%   BMI 27.46 kg/m   Physical Exam Constitutional:  General: She is not in acute distress.    Appearance: Normal appearance. She is obese. She is not ill-appearing or diaphoretic.  HENT:     Head: Normocephalic and atraumatic. No raccoon eyes or Battle's sign.     Jaw: There is normal jaw occlusion. No trismus.     Right Ear: External ear normal.     Left Ear: External ear normal.     Nose: Nose normal.     Mouth/Throat:     Lips: Pink.     Mouth: Mucous membranes are moist.     Pharynx: Oropharynx is clear.  Eyes:     General: Vision grossly intact. Gaze aligned  appropriately.     Extraocular Movements: Extraocular movements intact.     Conjunctiva/sclera: Conjunctivae normal.     Pupils: Pupils are equal, round, and reactive to light.  Neck:     Musculoskeletal: Full passive range of motion without pain, normal range of motion and neck supple. No neck rigidity.     Trachea: Trachea and phonation normal. No tracheal tenderness or tracheal deviation.  Cardiovascular:     Rate and Rhythm: Normal rate and regular rhythm.     Pulses:          Radial pulses are 2+ on the right side and 2+ on the left side.       Dorsalis pedis pulses are 2+ on the right side and 2+ on the left side.  Pulmonary:     Effort: Pulmonary effort is normal. No respiratory distress.  Abdominal:     General: Bowel sounds are normal. There is no distension.     Palpations: Abdomen is soft. There is no pulsatile mass.     Tenderness: There is no abdominal tenderness. There is no guarding or rebound.  Genitourinary:    Comments: Exam deferred by patient Musculoskeletal:       Back:     Comments: No midline C/T/L spinal tenderness to palpation no deformity, crepitus, or step-off noted. No sign of injury to the neck or back.  Reproducible tenderness to palpation of the left gluteus muscles. - Positive left straight leg raise   Feet:     Right foot:     Protective Sensation: 5 sites tested. 5 sites sensed.     Left foot:     Protective Sensation: 5 sites tested. 5 sites sensed.  Skin:    General: Skin is warm and dry.     Capillary Refill: Capillary refill takes less than 2 seconds.  Neurological:     Mental Status: She is alert.     GCS: GCS eye subscore is 4. GCS verbal subscore is 5. GCS motor subscore is 6.     Comments: Speech is clear and goal oriented, follows commands Major Cranial nerves without deficit, no facial droop Normal strength in upper and lower extremities bilaterally including dorsiflexion and plantar flexion, strong and equal grip strength  Sensation normal to light and sharp touch Moves extremities without ataxia, coordination intact Normal gait DTR 2+ bilateral patella, no clonus of the feet  Psychiatric:        Behavior: Behavior is cooperative.    ED Treatments / Results  Labs (all labs ordered are listed, but only abnormal results are displayed) Labs Reviewed  URINALYSIS, COMPLETE (UACMP) WITH MICROSCOPIC - Abnormal; Notable for the following components:      Result Value   APPearance HAZY (*)    Hgb urine dipstick SMALL (*)    Leukocytes,Ua TRACE (*)  Bacteria, UA RARE (*)    All other components within normal limits  URINE CULTURE  POC URINE PREG, ED    EKG None  Radiology No results found.  Procedures Procedures (including critical care time)  Medications Ordered in ED Medications  lidocaine (LIDODERM) 5 % 1 patch (1 patch Transdermal Patch Applied 02/11/19 1821)  naproxen (NAPROSYN) tablet 375 mg (375 mg Oral Given 02/11/19 1821)  methocarbamol (ROBAXIN) tablet 500 mg (500 mg Oral Given 02/11/19 1821)     Initial Impression / Assessment and Plan / ED Course  I have reviewed the triage vital signs and the nursing notes.  Pertinent labs & imaging results that were available during my care of the patient were reviewed by me and considered in my medical decision making (see chart for details).    Janet Wolfe is a 53 y.o. female presenting with left sided lower back pain that began after leaning over to pick up some close this morning. Patient denies history of trauma, fever, IV drug use, night sweats, weight loss, cancer, saddle anesthesia, urinary rentention, bowel/bladder incontinence. No neurological deficits and normal neuro exam.  Pain is consistently reproducible to palpation of the left gluteus medius muscle; suspect musculoskeletal etiology of patient's pain. Abdomen soft/nontender and without pulsatile mass. Patient with equal pedal pulses. Doubt spinal epidural abscess, cauda equina or  AAA.  Imaging not indicated at this time.   Urinalysis ordered by triage staff shows trace leukocytes, 6-10 white blood cells and rare bacteria, nitrite negative. Patient is without urinary symptoms, doubt urinary tract infection, kidney stone or pyelonephritis, will order urine culture, no indication for antibiotics at this time as she is asymptomatic regarding this.  Will send for culture. Discussed with Dr. Lynelle DoctorKnapp.  Urine pregnancy negative  Patient is ambulatory in the emergency department without assistance. RICE protocol and pain medicine indicated and discussed with patient.   Naproxen 500mg  BID prescribed. Patient denies history of CKD or gastric ulcers/bleeding. Robaxin 500mg  BID prescribed. Patient informed to avoid driving or operating heavy machinery while taking muscle relaxer. Lidoderm patch prescribed. - At this time there does not appear to be any evidence of an acute emergency medical condition and the patient appears stable for discharge with appropriate outpatient follow up. Diagnosis was discussed with patient who verbalizes understanding of care plan and is agreeable to discharge. I have discussed return precautions with patient who verbalizes understanding of return precautions. Patient encouraged to follow-up with their PCP. All questions answered.  Patient has been discharged in good condition.   Note: Portions of this report may have been transcribed using voice recognition software. Every effort was made to ensure accuracy; however, inadvertent computerized transcription errors may still be present. Final Clinical Impressions(s) / ED Diagnoses   Final diagnoses:  Acute left-sided low back pain without sciatica  Muscle strain    ED Discharge Orders         Ordered    methocarbamol (ROBAXIN) 500 MG tablet  2 times daily     02/11/19 1820    naproxen (NAPROSYN) 375 MG tablet  2 times daily     02/11/19 1820    lidocaine (LIDODERM) 5 %  Every 24 hours     02/11/19  1820           Bill SalinasMorelli, Zyan Coby A, PA-C 02/11/19 1823    Elizabeth PalauMorelli, Angelos Wasco A, PA-C 02/11/19 Vashti [id number]    Knapp, Jon, MD 02/12/19 1322

## 2019-02-11 NOTE — Discharge Instructions (Addendum)
You have been diagnosed today with muscle strain with left lower back pain.  At this time there does not appear to be the presence of an emergent medical condition, however there is always the potential for conditions to change. Please read and follow the below instructions.  Please return to the Emergency Department immediately for any new or worsening symptoms. Please be sure to follow up with your Primary Care Provider within one week regarding your visit today; please call their office to schedule an appointment even if you are feeling better for a follow-up visit. You may take the muscle relaxer Robaxin as prescribed to help with your symptoms.  Do not drive or operate heavy machinery while taking Robaxin as will make you drowsy.  Do not drink alcohol or take other sedating medications with Robaxin as this will worsen side effects. You may use the medication naproxen as prescribed to help with your symptoms.  Please drink plenty of water while taking this medication.  Take this medication with food.  Do not take other NSAID-containing medications including ibuprofen, Advil or Aleve with this medication. You may use the Lidoderm patches as prescribed to help with your symptoms. Your urine was sent for culture today, if this grows out bacteria that will need antibiotic treatment you will be contacted by nursing staff regarding this.  You may check your MyChart account for results in 2-3 days.  Get help right away if: You develop new bowel or bladder control problems. You have unusual weakness or numbness in your arms or legs. You develop nausea or vomiting. You develop abdominal pain. You feel faint. You have fever or chills You have pain when you pee or blood in your urine. Any new/concerning or worsening symptoms  Please read the additional information packets attached to your discharge summary.  Do not take your medicine if  develop an itchy rash, swelling in your mouth or lips, or  difficulty breathing; call 911 and seek immediate emergency medical attention if this occurs.

## 2019-02-13 LAB — URINE CULTURE

## 2019-02-14 DIAGNOSIS — M545 Low back pain: Secondary | ICD-10-CM | POA: Diagnosis not present

## 2019-02-20 DIAGNOSIS — M545 Low back pain: Secondary | ICD-10-CM | POA: Diagnosis not present

## 2019-02-20 DIAGNOSIS — I10 Essential (primary) hypertension: Secondary | ICD-10-CM | POA: Diagnosis not present

## 2019-03-13 DIAGNOSIS — M549 Dorsalgia, unspecified: Secondary | ICD-10-CM | POA: Diagnosis not present

## 2019-03-13 DIAGNOSIS — K760 Fatty (change of) liver, not elsewhere classified: Secondary | ICD-10-CM | POA: Diagnosis not present

## 2019-03-13 DIAGNOSIS — E669 Obesity, unspecified: Secondary | ICD-10-CM | POA: Diagnosis not present

## 2019-03-13 DIAGNOSIS — R1032 Left lower quadrant pain: Secondary | ICD-10-CM | POA: Diagnosis not present

## 2019-04-02 ENCOUNTER — Other Ambulatory Visit: Payer: Self-pay

## 2019-04-02 ENCOUNTER — Encounter: Payer: Self-pay | Admitting: Rheumatology

## 2019-04-02 ENCOUNTER — Ambulatory Visit: Payer: Self-pay

## 2019-04-02 ENCOUNTER — Ambulatory Visit: Payer: BC Managed Care – PPO | Admitting: Rheumatology

## 2019-04-02 VITALS — BP 110/76 | HR 79 | Resp 13 | Ht 64.0 in | Wt 200.2 lb

## 2019-04-02 DIAGNOSIS — K760 Fatty (change of) liver, not elsewhere classified: Secondary | ICD-10-CM | POA: Diagnosis not present

## 2019-04-02 DIAGNOSIS — Z8639 Personal history of other endocrine, nutritional and metabolic disease: Secondary | ICD-10-CM

## 2019-04-02 DIAGNOSIS — Z8719 Personal history of other diseases of the digestive system: Secondary | ICD-10-CM | POA: Diagnosis not present

## 2019-04-02 DIAGNOSIS — M533 Sacrococcygeal disorders, not elsewhere classified: Secondary | ICD-10-CM | POA: Diagnosis not present

## 2019-04-02 DIAGNOSIS — G8929 Other chronic pain: Secondary | ICD-10-CM

## 2019-04-02 DIAGNOSIS — Z86010 Personal history of colonic polyps: Secondary | ICD-10-CM

## 2019-04-02 DIAGNOSIS — Z8669 Personal history of other diseases of the nervous system and sense organs: Secondary | ICD-10-CM

## 2019-04-02 DIAGNOSIS — Z8659 Personal history of other mental and behavioral disorders: Secondary | ICD-10-CM

## 2019-04-02 DIAGNOSIS — M5440 Lumbago with sciatica, unspecified side: Secondary | ICD-10-CM

## 2019-04-02 DIAGNOSIS — G4709 Other insomnia: Secondary | ICD-10-CM

## 2019-04-02 NOTE — Progress Notes (Signed)
Office Visit Note  Patient: Janet BlazingMarilyn C Belter             Date of Birth: March 04, 1966           MRN: [id number]             PCP: Pearson GrippeKim, James, MD Referring: Charna ElizabethMann, Jyothi, MD Visit Date: 04/02/2019 Occupation: Receptionist at [employer]  Subjective:  Lower back pain.   History of Present Illness: Janet Wolfe is a 53 y.o. female seen in consultation per request of Dr. Loreta AveMann for evaluation of her left lower back pain.  According to patient about 2 months ago she came out of the shower and pulling up her clothes when she experienced sudden sharp pain in the left lower side of her back which radiated to her abdominal area.  She states she rested for a few hours and then she tried to get up and the pain was very severe to the point that she had to go to the emergency room.  She states there she had evaluation and was given prednisone taper and meloxicam.  She states the prednisone taper helped her and then after she finished prednisone she took meloxicam which did not help her at all.  She states she followed up with her PCP who recommended MRI of her lumbar spine.  She states the co-pay was very high and she postpone the MRI till December 2020.  She continues to have lower back pain radiating to the abdominal region which she describes to the left lower quadrant.  She states she was given Robaxin which is not helping at all.  She was also evaluated by Dr. Quincy SheehanMeehan and her GI work-up was negative.  Activities of Daily Living:  Patient reports morning stiffness for 1 hour.   Patient Denies nocturnal pain.  Difficulty dressing/grooming: Denies Difficulty climbing stairs: Reports Difficulty getting out of chair: Reports Difficulty using hands for taps, buttons, cutlery, and/or writing: Denies  Review of Systems  Constitutional: Positive for fatigue. Negative for night sweats, weight gain and weight loss.  HENT: Negative for mouth sores, trouble swallowing, trouble swallowing, mouth dryness  and nose dryness.   Eyes: Negative for pain, redness, visual disturbance and dryness.  Respiratory: Negative for cough, shortness of breath and difficulty breathing.   Cardiovascular: Negative for chest pain, palpitations, hypertension, irregular heartbeat and swelling in legs/feet.  Gastrointestinal: Negative for blood in stool, constipation and diarrhea.  Endocrine: Negative for increased urination.  Genitourinary: Negative for vaginal dryness.  Musculoskeletal: Positive for arthralgias, joint pain, myalgias, morning stiffness and myalgias. Negative for joint swelling, muscle weakness and muscle tenderness.  Skin: Negative for color change, rash, hair loss, skin tightness, ulcers and sensitivity to sunlight.  Allergic/Immunologic: Negative for susceptible to infections.  Neurological: Negative for dizziness, memory loss, night sweats and weakness.  Hematological: Negative for swollen glands.  Psychiatric/Behavioral: Positive for depressed mood and sleep disturbance. The patient is nervous/anxious.     PMFS History:  There are no active problems to display for this patient.   Past Medical History:  Diagnosis Date   Hypertension    Migraine    Thyroid disease     Family History  Problem Relation Age of Onset   Dementia Father    Depression Sister    Migraines Sister    Healthy Son    Healthy Daughter    Healthy Daughter    Past Surgical History:  Procedure Laterality Date   ABDOMINAL HYSTERECTOMY     TUBAL  LIGATION     Social History   Social History Narrative   Not on file    There is no immunization history on file for this patient.   Objective: Vital Signs: BP 110/76 (BP Location: Right Arm, Patient Position: Sitting, Cuff Size: Normal)    Pulse 79    Resp 13    Ht 5\' 4"  (1.626 m)    Wt 200 lb 3.2 oz (90.8 kg)    BMI 34.36 kg/m    Physical Exam Vitals signs and nursing note reviewed.  Constitutional:      Appearance: She is well-developed.  HENT:       Head: Normocephalic and atraumatic.  Eyes:     Conjunctiva/sclera: Conjunctivae normal.  Neck:     Musculoskeletal: Normal range of motion.  Cardiovascular:     Rate and Rhythm: Normal rate and regular rhythm.     Heart sounds: Normal heart sounds.  Pulmonary:     Effort: Pulmonary effort is normal.     Breath sounds: Normal breath sounds.  Abdominal:     General: Bowel sounds are normal.     Palpations: Abdomen is soft.  Lymphadenopathy:     Cervical: No cervical adenopathy.  Skin:    General: Skin is warm and dry.     Capillary Refill: Capillary refill takes less than 2 seconds.  Neurological:     Mental Status: She is alert and oriented to person, place, and time.  Psychiatric:        Behavior: Behavior normal.      Musculoskeletal Exam: C-spine thoracic and lumbar spine with a fairly good range of motion.  She has some tenderness on palpation of her lower lumbar region.  She also had tenderness on palpation of her left SI joint.  Shoulder joints, elbow joints, wrist joints, MCPs, PIPs and DIPs with good range of motion with no synovitis.  Hip joints, knee joints, ankles, MTPs and PIPs with good range of motion with no synovitis.  CDAI Exam: CDAI Score: -- Patient Global: --; Provider Global: -- Swollen: --; Tender: -- Joint Exam   No joint exam has been documented for this visit   There is currently no information documented on the homunculus. Go to the Rheumatology activity and complete the homunculus joint exam.  Investigation: No additional findings.  Imaging: No results found.  Recent Labs: No results found for: WBC, HGB, PLT, NA, K, CL, CO2, GLUCOSE, BUN, CREATININE, BILITOT, ALKPHOS, AST, ALT, PROT, ALBUMIN, CALCIUM, GFRAA, QFTBGOLD, QFTBGOLDPLUS   02/21/2018 CBC normal, UA positive for bacterial infection, CMP normal, lipid panel LDL 106, TSH normal, hemoglobin A1c 5.3 Speciality Comments: No specialty comments available.  Procedures:  No procedures  performed Allergies: Patient has no known allergies.   Assessment / Plan:     Visit Diagnoses: Chronic left-sided low back pain with sciatica, sciatica laterality unspecified -patient has been experiencing left-sided back pain radiating to the abdominal region for the last 2 months.  She states the pain started suddenly when she got up from bending over.  She had good response to prednisone taper in the beginning but the pain persist despite taking NSAIDs and muscle relaxers.  She could not afford the MRI and it was not performed.  Plan: XR Lumbar Spine 2-3 Views x-ray of the lumbar spine was consistent with scoliosis, degenerative changes and facet joint arthropathy.  She would benefit from physical therapy.  I have given her a handout on back exercises.  I have advised her to contact me  in case her symptoms do not improve.  Otherwise I will see her back in 3 months.  Chronic left SI joint pain -she also has some tenderness over left SI joint.  Plan: XR Pelvis 1-2 Views.  The SI joint x-rays were consistent with degenerative changes.  Other medical problems are listed as follows:  Fatty infiltration of liver  History of IBS  Hx of colonic polyps  Hx of migraines  Other insomnia  History of anxiety  History of hypothyroidism  Orders: Orders Placed This Encounter  Procedures   XR Lumbar Spine 2-3 Views   XR Pelvis 1-2 Views   No orders of the defined types were placed in this encounter.   Face-to-face time spent with patient was 45 minutes. Greater than 50% of time was spent in counseling and coordination of care.  Follow-Up Instructions: Return for LBP.   Pollyann SavoyShaili Kinaya Hilliker, MD  Note - This record has been created using Animal [employer]Dragon software.  Chart creation errors have been sought, but may not always  have been located. Such creation errors do not reflect on  the standard of medical care.

## 2019-04-02 NOTE — Patient Instructions (Signed)

## 2019-04-17 ENCOUNTER — Ambulatory Visit: Payer: BLUE CROSS/BLUE SHIELD

## 2019-04-17 NOTE — Progress Notes (Signed)
Office Visit Note  Patient: Janet Wolfe             Date of Birth: [birth year]           MRN: [id number]             PCP: Pearson GrippeKim, James, MD Referring: Pearson GrippeKim, James, MD Visit Date: 05/01/2019 Occupation: @GUARO[employer] @  Subjective:  Left sided lower back pain   History of Present Illness: Janet Wolfe is a 53 y.o. female with history of DDD and left SI joint pain.  She continues to have persistent lower back pain that started on 02/11/2019.  She denies any symptoms of radiculopathy. She denies any numbness or weakness.  She has occasional left SI joint pain.  She says she started performing back exercises at home but has not scheduled physical therapy yet.  She is planning on scheduling physical therapy around her work.  She has been taking Robaxin 500 mg 2 tablets 3 times daily PRN for muscle spasms.  She has tried Lidoderm patches in the past which were ineffective.  She states that the pain is constant and experiences nocturnal pain.  She denies any other joint pain or joint swelling at this time.  She has no other new concerns.   Activities of Daily Living:  Patient reports morning stiffness for 1 hour.   Patient Reports nocturnal pain.  Difficulty dressing/grooming: Reports Difficulty climbing stairs: Reports Difficulty getting out of chair: Denies Difficulty using hands for taps, buttons, cutlery, and/or writing: Denies  Review of Systems  Constitutional: Negative for fatigue.  HENT: Negative for mouth sores, mouth dryness and nose dryness.   Eyes: Negative for itching and dryness.  Respiratory: Negative for shortness of breath, wheezing and difficulty breathing.   Cardiovascular: Negative for chest pain and palpitations.  Gastrointestinal: Negative for blood in stool, constipation and diarrhea.  Endocrine: Negative for increased urination.  Genitourinary: Negative for difficulty urinating and painful urination.  Musculoskeletal: Positive for arthralgias, joint pain and morning  stiffness. Negative for joint swelling.  Skin: Negative for rash and hair loss.  Allergic/Immunologic: Negative for susceptible to infections.  Neurological: Negative for dizziness, light-headedness, headaches, memory loss and weakness.  Hematological: Negative for bruising/bleeding tendency.  Psychiatric/Behavioral: Negative for confusion and sleep disturbance.    PMFS History:  There are no active problems to display for this patient.   Past Medical History:  Diagnosis Date  . Hypertension   . Migraine   . Thyroid disease     Family History  Problem Relation Age of Onset  . Dementia Father   . Depression Sister   . Migraines Sister   . Healthy Son   . Healthy Daughter   . Healthy Daughter    Past Surgical History:  Procedure Laterality Date  . ABDOMINAL HYSTERECTOMY    . TUBAL LIGATION     Social History   Social History Narrative  . Not on file    There is no immunization history on file for this patient.   Objective: Vital Signs: BP 120/75 (BP Location: Left Arm, Patient Position: Sitting, Cuff Size: Normal)   Pulse 73   Resp 15   Ht 5\' 4"  (1.626 m)   Wt 199 lb (90.3 kg)   BMI 34.16 kg/m    Physical Exam Vitals signs and nursing note reviewed.  Constitutional:      Appearance: She is well-developed.  HENT:     Head: Normocephalic and atraumatic.  Eyes:     Conjunctiva/sclera: Conjunctivae normal.  Neck:     Musculoskeletal: Normal range of motion.  Cardiovascular:     Rate and Rhythm: Normal rate and regular rhythm.     Heart sounds: Normal heart sounds.  Pulmonary:     Effort: Pulmonary effort is normal.     Breath sounds: Normal breath sounds.  Abdominal:     General: Bowel sounds are normal.     Palpations: Abdomen is soft.  Lymphadenopathy:     Cervical: No cervical adenopathy.  Skin:    General: Skin is warm and dry.     Capillary Refill: Capillary refill takes less than 2 seconds.  Neurological:     Mental Status: She is alert and  oriented to person, place, and time.  Psychiatric:        Behavior: Behavior normal.      Musculoskeletal Exam: C-spine, thoracic spine, lumbar spine good range of motion.  She has tenderness in the left paraspinal muscles in the lumbar region.  She has mild left SI joint tenderness.  Shoulder joints, elbow joints, wrist joints, MCPs, PIPs, DIPs good range of motion no synovitis.  She has complete fist formation bilaterally.  Hip joints, knee joints, ankle joints, MTPs, PIPs and DIPs good range of motion with no synovitis.  No warmth or effusion of bilateral knee joints.  No tenderness or swelling of ankle joints.  CDAI Exam: CDAI Score: - Patient Global: -; Provider Global: - Swollen: -; Tender: - Joint Exam   No joint exam has been documented for this visit   There is currently no information documented on the homunculus. Go to the Rheumatology activity and complete the homunculus joint exam.  Investigation: No additional findings.  Imaging: Xr Lumbar Spine 2-3 Views  Result Date: 04/02/2019 Dextroscoliosis was noted.  No significant disc space narrowing was noted.  Facet joint arthropathy was noted.  Anterior spurring was noted.  Narrowing between T11 and T12 was noted. Impression: These findings are consistent with dextroscoliosis, mild degenerative disc disease and facet joint arthropathy.  Xr Pelvis 1-2 Views  Result Date: 04/02/2019 Some degenerative changes were noted in the right SI joint.  No SI joint sclerosis was noted. Impression: Mild osteoarthritic changes were noted in the SI joints.   Recent Labs: No results found for: WBC, HGB, PLT, NA, K, CL, CO2, GLUCOSE, BUN, CREATININE, BILITOT, ALKPHOS, AST, ALT, PROT, ALBUMIN, CALCIUM, GFRAA, QFTBGOLD, QFTBGOLDPLUS  Speciality Comments: No specialty comments available.  Procedures:  No procedures performed Allergies: Patient has no known allergies.   Assessment / Plan:     Visit Diagnoses: DDD (degenerative disc  disease), lumbar - X-ray showed a scoliosis, degenerative changes and facet joint arthropathy on 04/02/2019.  She continues to have persistent left-sided lower back pain.  She has no symptoms of sciatica at this time.  Her pain started on 02/11/2019 and has been constant.  She is also been experiencing nocturnal pain.  In the past she had a good response to prednisone and has tried NSAIDs, Lidoderm patches, and Robaxin without much relief.  She has started to perform back exercises on a regular basis but has not started to notice any improvement.  At her initial visit she was referred to physical therapy but she has not scheduled an appointment yet.  She was encouraged to schedule appointment physical therapy.  She was advised to notify us if she develops any new or worsening symptoms.  She will follow-up in the office in 3 months.   Chronic left SI joint pain - Degenerative changes  were noted on the x-ray on 04/02/2019.  She has mild left SI joint tenderness on exam today.  This does not seem to be the primary source of her discomfort.  We discussed that if she develops increased pain in the left SI joint a cortisone injection could be a treatment option in the future.  She will notify us if the pain worsens.  Other medical conditions are listed as follows:  Fatty infiltration of liver - followed by Dr. Collene Mares  History of IBS  Hx of colonic polyps  Hx of migraines  Other insomnia  History of anxiety  History of hypothyroidism  Orders: No orders of the defined types were placed in this encounter.  No orders of the defined types were placed in this encounter.     Follow-Up Instructions: Return in about 3 months (around 07/31/2019) for DDD.   Ofilia Neas, PA-C   I examined and evaluated the patient with Hazel Sams PA.  Patient continues to have some left lumbar paraspinal pain.  I think she would benefit from physical therapy.  Have advised her to schedule an appointment with the physical  therapist.  She is supposed to notify us if her symptoms persist.  The plan of care was discussed as noted above.  Bo Merino, MD  Note - This record has been created using Editor, commissioning.  Chart creation errors have been sought, but may not always  have been located. Such creation errors do not reflect on  the standard of medical care.

## 2019-04-30 ENCOUNTER — Ambulatory Visit: Payer: BC Managed Care – PPO | Admitting: Rheumatology

## 2019-05-01 ENCOUNTER — Other Ambulatory Visit: Payer: Self-pay

## 2019-05-01 ENCOUNTER — Encounter: Payer: Self-pay | Admitting: Rheumatology

## 2019-05-01 ENCOUNTER — Ambulatory Visit: Payer: BC Managed Care – PPO | Admitting: Rheumatology

## 2019-05-01 VITALS — BP 120/75 | HR 73 | Resp 15 | Ht 64.0 in | Wt 199.0 lb

## 2019-05-01 DIAGNOSIS — Z86010 Personal history of colonic polyps: Secondary | ICD-10-CM

## 2019-05-01 DIAGNOSIS — M5136 Other intervertebral disc degeneration, lumbar region: Secondary | ICD-10-CM | POA: Diagnosis not present

## 2019-05-01 DIAGNOSIS — Z8639 Personal history of other endocrine, nutritional and metabolic disease: Secondary | ICD-10-CM

## 2019-05-01 DIAGNOSIS — M533 Sacrococcygeal disorders, not elsewhere classified: Secondary | ICD-10-CM

## 2019-05-01 DIAGNOSIS — Z8719 Personal history of other diseases of the digestive system: Secondary | ICD-10-CM | POA: Diagnosis not present

## 2019-05-01 DIAGNOSIS — K760 Fatty (change of) liver, not elsewhere classified: Secondary | ICD-10-CM

## 2019-05-01 DIAGNOSIS — G4709 Other insomnia: Secondary | ICD-10-CM

## 2019-05-01 DIAGNOSIS — Z8669 Personal history of other diseases of the nervous system and sense organs: Secondary | ICD-10-CM

## 2019-05-01 DIAGNOSIS — Z8659 Personal history of other mental and behavioral disorders: Secondary | ICD-10-CM

## 2019-05-01 DIAGNOSIS — G8929 Other chronic pain: Secondary | ICD-10-CM

## 2019-05-18 ENCOUNTER — Ambulatory Visit
Admission: RE | Admit: 2019-05-18 | Discharge: 2019-05-18 | Disposition: A | Discharge status: Home / Self Care | Payer: BC Managed Care – PPO | Source: Ambulatory Visit | Attending: Internal Medicine | Admitting: Internal Medicine

## 2019-05-18 ENCOUNTER — Other Ambulatory Visit: Payer: Self-pay

## 2019-05-18 DIAGNOSIS — Z1231 Encounter for screening mammogram for malignant neoplasm of breast: Secondary | ICD-10-CM | POA: Diagnosis not present

## 2019-05-29 ENCOUNTER — Ambulatory Visit: Payer: BC Managed Care – PPO | Admitting: Rheumatology

## 2019-07-03 DIAGNOSIS — Z20828 Contact with and (suspected) exposure to other viral communicable diseases: Secondary | ICD-10-CM | POA: Diagnosis not present

## 2019-07-10 DIAGNOSIS — D2262 Melanocytic nevi of left upper limb, including shoulder: Secondary | ICD-10-CM | POA: Diagnosis not present

## 2019-07-10 DIAGNOSIS — Z85828 Personal history of other malignant neoplasm of skin: Secondary | ICD-10-CM | POA: Diagnosis not present

## 2019-07-10 DIAGNOSIS — D225 Melanocytic nevi of trunk: Secondary | ICD-10-CM | POA: Diagnosis not present

## 2019-07-10 DIAGNOSIS — D2261 Melanocytic nevi of right upper limb, including shoulder: Secondary | ICD-10-CM | POA: Diagnosis not present

## 2019-08-06 DIAGNOSIS — I10 Essential (primary) hypertension: Secondary | ICD-10-CM | POA: Diagnosis not present

## 2019-08-06 DIAGNOSIS — E039 Hypothyroidism, unspecified: Secondary | ICD-10-CM | POA: Diagnosis not present

## 2019-08-06 DIAGNOSIS — R739 Hyperglycemia, unspecified: Secondary | ICD-10-CM | POA: Diagnosis not present

## 2019-08-13 DIAGNOSIS — E7800 Pure hypercholesterolemia, unspecified: Secondary | ICD-10-CM | POA: Diagnosis not present

## 2019-08-13 DIAGNOSIS — Z0000 Encounter for general adult medical examination without abnormal findings: Secondary | ICD-10-CM | POA: Diagnosis not present

## 2019-08-13 DIAGNOSIS — I10 Essential (primary) hypertension: Secondary | ICD-10-CM | POA: Diagnosis not present

## 2019-08-13 DIAGNOSIS — G43909 Migraine, unspecified, not intractable, without status migrainosus: Secondary | ICD-10-CM | POA: Diagnosis not present

## 2019-08-14 ENCOUNTER — Ambulatory Visit: Payer: BC Managed Care – PPO | Admitting: Rheumatology

## 2019-08-30 DIAGNOSIS — L7211 Pilar cyst: Secondary | ICD-10-CM | POA: Diagnosis not present

## 2019-09-10 DIAGNOSIS — Z4802 Encounter for removal of sutures: Secondary | ICD-10-CM | POA: Diagnosis not present

## 2019-09-11 DIAGNOSIS — E669 Obesity, unspecified: Secondary | ICD-10-CM | POA: Diagnosis not present

## 2019-09-11 DIAGNOSIS — Z1211 Encounter for screening for malignant neoplasm of colon: Secondary | ICD-10-CM | POA: Diagnosis not present

## 2019-09-11 DIAGNOSIS — K580 Irritable bowel syndrome with diarrhea: Secondary | ICD-10-CM | POA: Diagnosis not present

## 2019-09-11 DIAGNOSIS — Z86010 Personal history of colonic polyps: Secondary | ICD-10-CM | POA: Diagnosis not present

## 2019-10-24 DIAGNOSIS — K635 Polyp of colon: Secondary | ICD-10-CM | POA: Diagnosis not present

## 2019-10-24 DIAGNOSIS — K6289 Other specified diseases of anus and rectum: Secondary | ICD-10-CM | POA: Diagnosis not present

## 2019-10-24 DIAGNOSIS — D120 Benign neoplasm of cecum: Secondary | ICD-10-CM | POA: Diagnosis not present

## 2019-10-24 DIAGNOSIS — Z1211 Encounter for screening for malignant neoplasm of colon: Secondary | ICD-10-CM | POA: Diagnosis not present

## 2019-10-24 DIAGNOSIS — D122 Benign neoplasm of ascending colon: Secondary | ICD-10-CM | POA: Diagnosis not present

## 2019-12-06 DIAGNOSIS — L723 Sebaceous cyst: Secondary | ICD-10-CM | POA: Diagnosis not present

## 2019-12-06 DIAGNOSIS — L7211 Pilar cyst: Secondary | ICD-10-CM | POA: Diagnosis not present

## 2020-01-24 DIAGNOSIS — I10 Essential (primary) hypertension: Secondary | ICD-10-CM | POA: Diagnosis not present

## 2020-01-24 DIAGNOSIS — E7800 Pure hypercholesterolemia, unspecified: Secondary | ICD-10-CM | POA: Diagnosis not present

## 2020-01-24 DIAGNOSIS — R739 Hyperglycemia, unspecified: Secondary | ICD-10-CM | POA: Diagnosis not present

## 2020-01-24 DIAGNOSIS — Z79899 Other long term (current) drug therapy: Secondary | ICD-10-CM | POA: Diagnosis not present

## 2020-01-28 DIAGNOSIS — G43909 Migraine, unspecified, not intractable, without status migrainosus: Secondary | ICD-10-CM | POA: Diagnosis not present

## 2020-01-28 DIAGNOSIS — F419 Anxiety disorder, unspecified: Secondary | ICD-10-CM | POA: Diagnosis not present

## 2020-01-28 DIAGNOSIS — Z79899 Other long term (current) drug therapy: Secondary | ICD-10-CM | POA: Diagnosis not present

## 2020-01-28 DIAGNOSIS — I10 Essential (primary) hypertension: Secondary | ICD-10-CM | POA: Diagnosis not present

## 2020-04-18 ENCOUNTER — Other Ambulatory Visit: Payer: Self-pay | Admitting: Internal Medicine

## 2020-04-18 DIAGNOSIS — Z1231 Encounter for screening mammogram for malignant neoplasm of breast: Secondary | ICD-10-CM

## 2020-05-30 ENCOUNTER — Other Ambulatory Visit: Payer: Self-pay

## 2020-05-30 ENCOUNTER — Ambulatory Visit
Admission: RE | Admit: 2020-05-30 | Discharge: 2020-05-30 | Disposition: A | Discharge status: Home / Self Care | Payer: BC Managed Care – PPO | Source: Ambulatory Visit | Attending: Internal Medicine | Admitting: Internal Medicine

## 2020-05-30 DIAGNOSIS — Z1231 Encounter for screening mammogram for malignant neoplasm of breast: Secondary | ICD-10-CM

## 2020-08-11 DIAGNOSIS — E039 Hypothyroidism, unspecified: Secondary | ICD-10-CM | POA: Diagnosis not present

## 2020-08-11 DIAGNOSIS — E781 Pure hyperglyceridemia: Secondary | ICD-10-CM | POA: Diagnosis not present

## 2020-08-11 DIAGNOSIS — R739 Hyperglycemia, unspecified: Secondary | ICD-10-CM | POA: Diagnosis not present

## 2020-08-11 DIAGNOSIS — I10 Essential (primary) hypertension: Secondary | ICD-10-CM | POA: Diagnosis not present

## 2020-09-09 DIAGNOSIS — I10 Essential (primary) hypertension: Secondary | ICD-10-CM | POA: Diagnosis not present

## 2020-09-09 DIAGNOSIS — Z0000 Encounter for general adult medical examination without abnormal findings: Secondary | ICD-10-CM | POA: Diagnosis not present

## 2020-09-09 DIAGNOSIS — E119 Type 2 diabetes mellitus without complications: Secondary | ICD-10-CM | POA: Diagnosis not present

## 2020-09-09 DIAGNOSIS — E7800 Pure hypercholesterolemia, unspecified: Secondary | ICD-10-CM | POA: Diagnosis not present

## 2020-11-18 DIAGNOSIS — D2261 Melanocytic nevi of right upper limb, including shoulder: Secondary | ICD-10-CM | POA: Diagnosis not present

## 2020-11-18 DIAGNOSIS — Z85828 Personal history of other malignant neoplasm of skin: Secondary | ICD-10-CM | POA: Diagnosis not present

## 2020-11-18 DIAGNOSIS — L720 Epidermal cyst: Secondary | ICD-10-CM | POA: Diagnosis not present

## 2020-11-18 DIAGNOSIS — D2262 Melanocytic nevi of left upper limb, including shoulder: Secondary | ICD-10-CM | POA: Diagnosis not present

## 2020-12-01 DIAGNOSIS — R739 Hyperglycemia, unspecified: Secondary | ICD-10-CM | POA: Diagnosis not present

## 2020-12-01 DIAGNOSIS — E7800 Pure hypercholesterolemia, unspecified: Secondary | ICD-10-CM | POA: Diagnosis not present

## 2020-12-01 DIAGNOSIS — E039 Hypothyroidism, unspecified: Secondary | ICD-10-CM | POA: Diagnosis not present

## 2020-12-01 DIAGNOSIS — I10 Essential (primary) hypertension: Secondary | ICD-10-CM | POA: Diagnosis not present

## 2020-12-09 DIAGNOSIS — F419 Anxiety disorder, unspecified: Secondary | ICD-10-CM | POA: Diagnosis not present

## 2020-12-09 DIAGNOSIS — E119 Type 2 diabetes mellitus without complications: Secondary | ICD-10-CM | POA: Diagnosis not present

## 2021-02-24 DIAGNOSIS — N3000 Acute cystitis without hematuria: Secondary | ICD-10-CM | POA: Diagnosis not present

## 2021-02-26 DIAGNOSIS — E669 Obesity, unspecified: Secondary | ICD-10-CM | POA: Diagnosis not present

## 2021-02-26 DIAGNOSIS — K760 Fatty (change of) liver, not elsewhere classified: Secondary | ICD-10-CM | POA: Diagnosis not present

## 2021-02-26 DIAGNOSIS — K580 Irritable bowel syndrome with diarrhea: Secondary | ICD-10-CM | POA: Diagnosis not present

## 2021-03-06 DIAGNOSIS — G43119 Migraine with aura, intractable, without status migrainosus: Secondary | ICD-10-CM | POA: Diagnosis not present

## 2021-03-14 DIAGNOSIS — J029 Acute pharyngitis, unspecified: Secondary | ICD-10-CM | POA: Diagnosis not present

## 2021-03-14 DIAGNOSIS — J069 Acute upper respiratory infection, unspecified: Secondary | ICD-10-CM | POA: Diagnosis not present

## 2021-03-14 DIAGNOSIS — A499 Bacterial infection, unspecified: Secondary | ICD-10-CM | POA: Diagnosis not present

## 2021-03-14 DIAGNOSIS — Z20822 Contact with and (suspected) exposure to covid-19: Secondary | ICD-10-CM | POA: Diagnosis not present

## 2021-05-11 ENCOUNTER — Other Ambulatory Visit: Payer: Self-pay | Admitting: Family Medicine

## 2021-05-11 DIAGNOSIS — Z1231 Encounter for screening mammogram for malignant neoplasm of breast: Secondary | ICD-10-CM

## 2021-06-08 DIAGNOSIS — E039 Hypothyroidism, unspecified: Secondary | ICD-10-CM | POA: Diagnosis not present

## 2021-06-08 DIAGNOSIS — E119 Type 2 diabetes mellitus without complications: Secondary | ICD-10-CM | POA: Diagnosis not present

## 2021-06-08 DIAGNOSIS — Z79899 Other long term (current) drug therapy: Secondary | ICD-10-CM | POA: Diagnosis not present

## 2021-06-08 DIAGNOSIS — I10 Essential (primary) hypertension: Secondary | ICD-10-CM | POA: Diagnosis not present

## 2021-06-08 DIAGNOSIS — E781 Pure hyperglyceridemia: Secondary | ICD-10-CM | POA: Diagnosis not present

## 2021-06-09 ENCOUNTER — Other Ambulatory Visit: Payer: Self-pay

## 2021-06-09 ENCOUNTER — Ambulatory Visit
Admission: RE | Admit: 2021-06-09 | Discharge: 2021-06-09 | Disposition: A | Discharge status: Home / Self Care | Payer: BC Managed Care – PPO | Source: Ambulatory Visit | Attending: Family Medicine | Admitting: Family Medicine

## 2021-06-09 DIAGNOSIS — Z1231 Encounter for screening mammogram for malignant neoplasm of breast: Secondary | ICD-10-CM | POA: Diagnosis not present

## 2021-06-16 DIAGNOSIS — E119 Type 2 diabetes mellitus without complications: Secondary | ICD-10-CM | POA: Diagnosis not present

## 2021-06-16 DIAGNOSIS — Z79899 Other long term (current) drug therapy: Secondary | ICD-10-CM | POA: Diagnosis not present

## 2021-06-16 DIAGNOSIS — I10 Essential (primary) hypertension: Secondary | ICD-10-CM | POA: Diagnosis not present

## 2021-06-16 DIAGNOSIS — E781 Pure hyperglyceridemia: Secondary | ICD-10-CM | POA: Diagnosis not present

## 2021-12-08 DIAGNOSIS — Z85828 Personal history of other malignant neoplasm of skin: Secondary | ICD-10-CM | POA: Diagnosis not present

## 2021-12-08 DIAGNOSIS — L820 Inflamed seborrheic keratosis: Secondary | ICD-10-CM | POA: Diagnosis not present

## 2021-12-08 DIAGNOSIS — D225 Melanocytic nevi of trunk: Secondary | ICD-10-CM | POA: Diagnosis not present

## 2021-12-08 DIAGNOSIS — D2261 Melanocytic nevi of right upper limb, including shoulder: Secondary | ICD-10-CM | POA: Diagnosis not present

## 2021-12-08 DIAGNOSIS — D2262 Melanocytic nevi of left upper limb, including shoulder: Secondary | ICD-10-CM | POA: Diagnosis not present

## 2021-12-21 DIAGNOSIS — E119 Type 2 diabetes mellitus without complications: Secondary | ICD-10-CM | POA: Diagnosis not present

## 2021-12-21 DIAGNOSIS — I10 Essential (primary) hypertension: Secondary | ICD-10-CM | POA: Diagnosis not present

## 2021-12-21 DIAGNOSIS — E781 Pure hyperglyceridemia: Secondary | ICD-10-CM | POA: Diagnosis not present

## 2021-12-21 DIAGNOSIS — Z79899 Other long term (current) drug therapy: Secondary | ICD-10-CM | POA: Diagnosis not present

## 2021-12-29 DIAGNOSIS — Z23 Encounter for immunization: Secondary | ICD-10-CM | POA: Diagnosis not present

## 2021-12-29 DIAGNOSIS — Z0000 Encounter for general adult medical examination without abnormal findings: Secondary | ICD-10-CM | POA: Diagnosis not present

## 2021-12-29 DIAGNOSIS — F329 Major depressive disorder, single episode, unspecified: Secondary | ICD-10-CM | POA: Diagnosis not present

## 2021-12-29 DIAGNOSIS — E039 Hypothyroidism, unspecified: Secondary | ICD-10-CM | POA: Diagnosis not present

## 2021-12-29 DIAGNOSIS — E119 Type 2 diabetes mellitus without complications: Secondary | ICD-10-CM | POA: Diagnosis not present

## 2022-02-10 DIAGNOSIS — E039 Hypothyroidism, unspecified: Secondary | ICD-10-CM | POA: Diagnosis not present

## 2022-02-10 DIAGNOSIS — F329 Major depressive disorder, single episode, unspecified: Secondary | ICD-10-CM | POA: Diagnosis not present

## 2022-02-10 DIAGNOSIS — F419 Anxiety disorder, unspecified: Secondary | ICD-10-CM | POA: Diagnosis not present

## 2022-02-10 DIAGNOSIS — E119 Type 2 diabetes mellitus without complications: Secondary | ICD-10-CM | POA: Diagnosis not present

## 2022-02-18 DIAGNOSIS — N3000 Acute cystitis without hematuria: Secondary | ICD-10-CM | POA: Diagnosis not present

## 2022-03-02 DIAGNOSIS — Z23 Encounter for immunization: Secondary | ICD-10-CM | POA: Diagnosis not present

## 2022-04-14 IMAGING — MG MM DIGITAL SCREENING BILAT W/ TOMO AND CAD
8 series · 8 of 24 positions shown · non-contrast
Comparison: Previous exam(s).

CLINICAL DATA: Screening.

EXAM:
DIGITAL SCREENING BILATERAL MAMMOGRAM WITH TOMOSYNTHESIS AND CAD
TECHNIQUE: Bilateral screening digital craniocaudal and mediolateral oblique
mammograms were obtained. Bilateral screening digital breast
tomosynthesis was performed. The images were evaluated with
computer-aided detection.

[L MLO synth-2D]
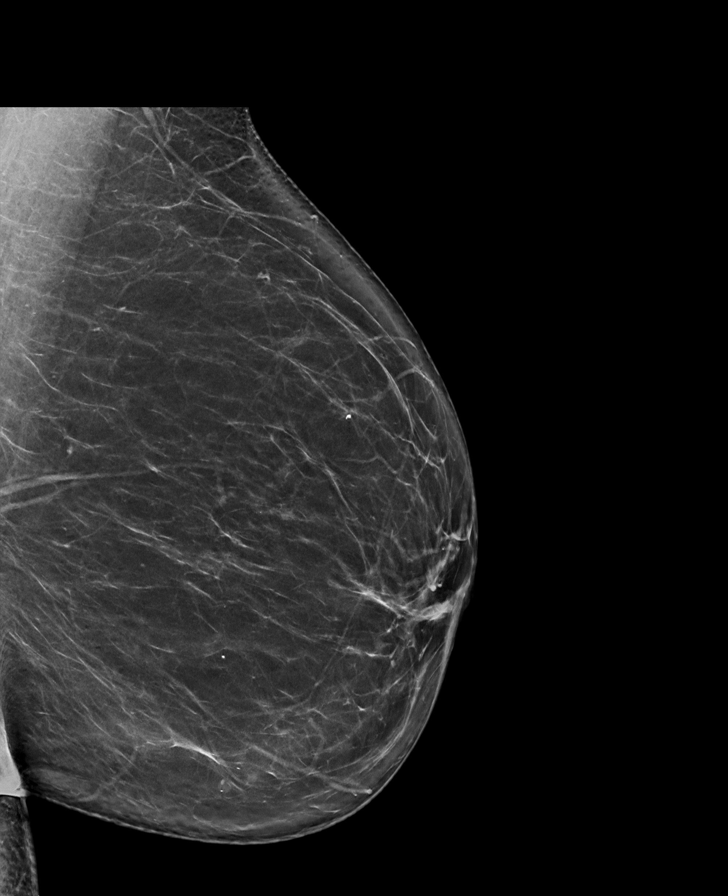

[R CC synth-2D]
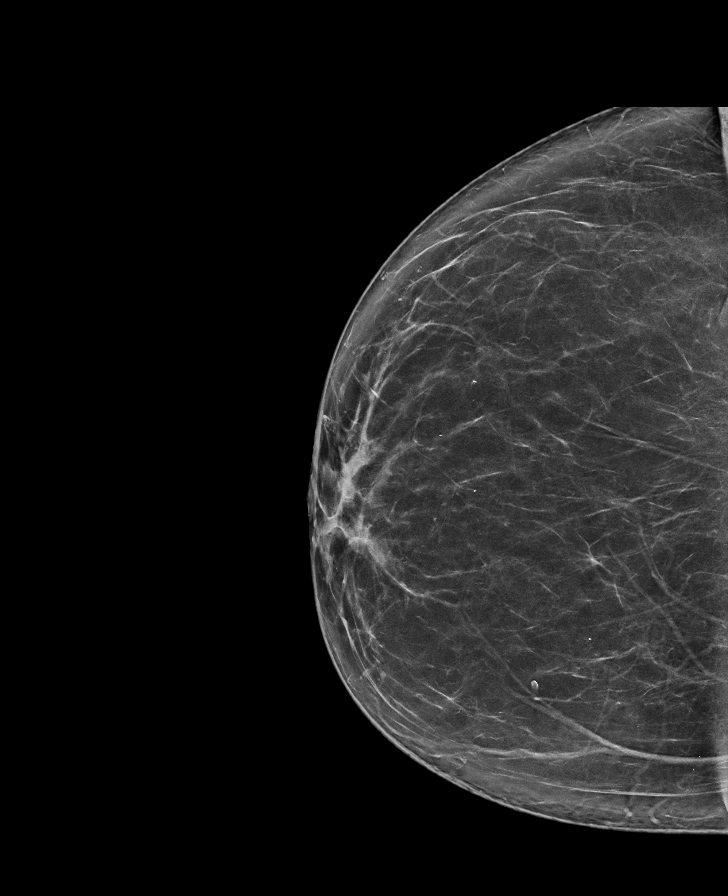

[L CC synth-2D]
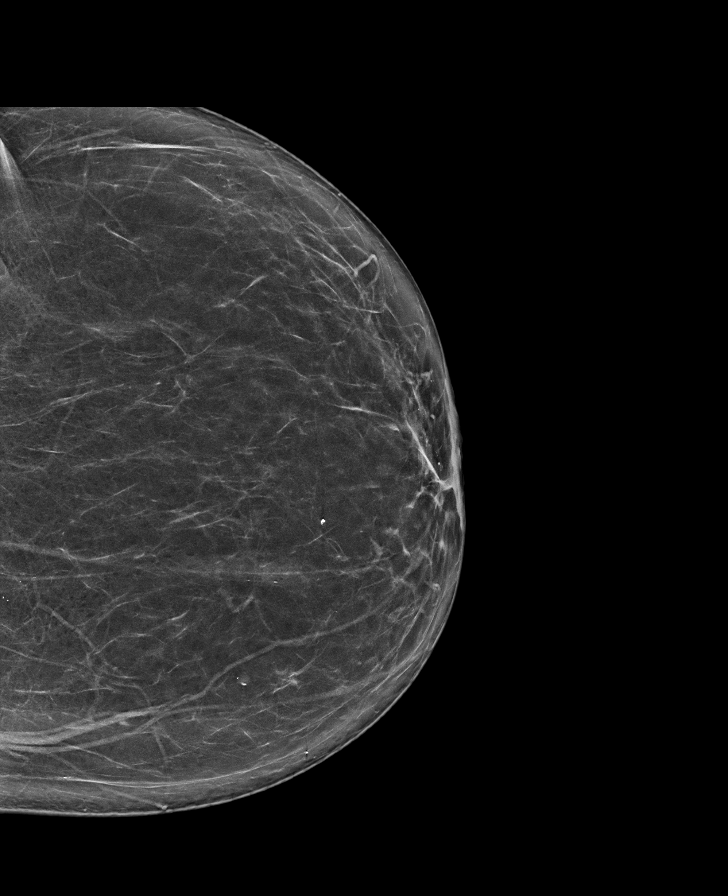

[R MLO synth-2D]
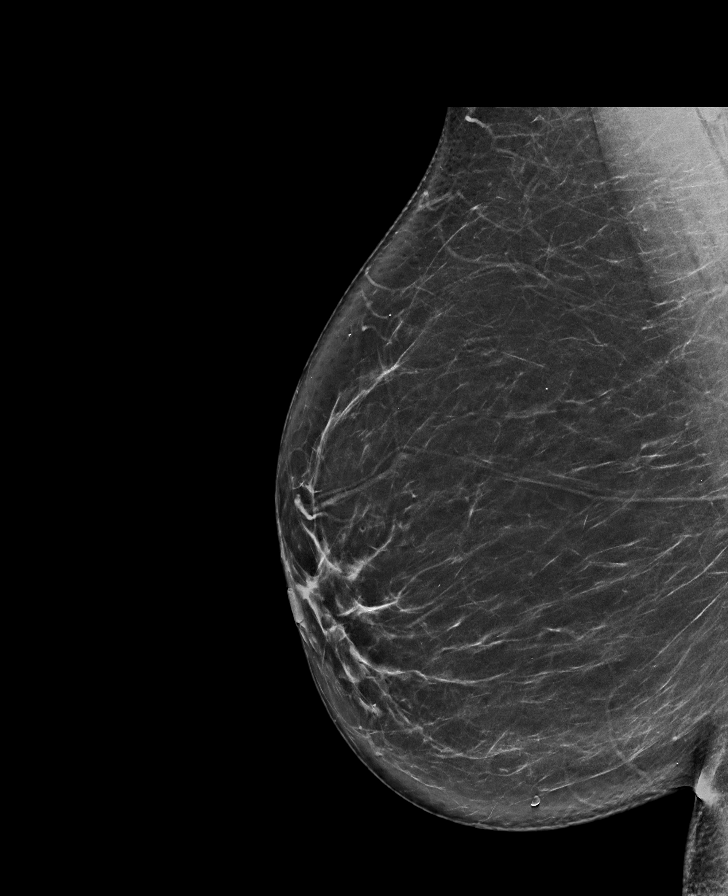

[R CC tomo · tomo slice 42/83.0]
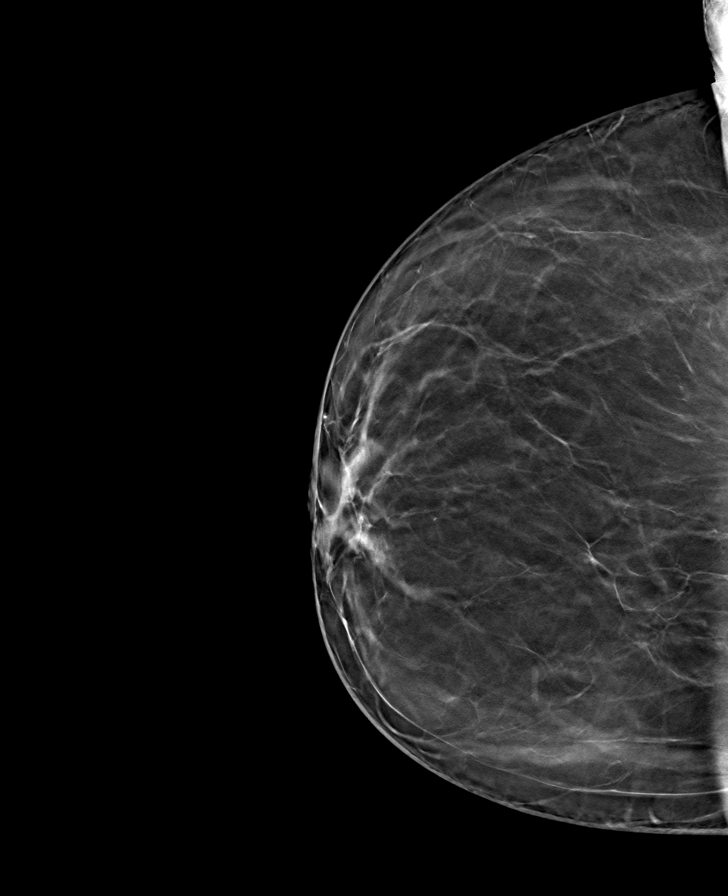

[L CC tomo · tomo slice 41/82.0]
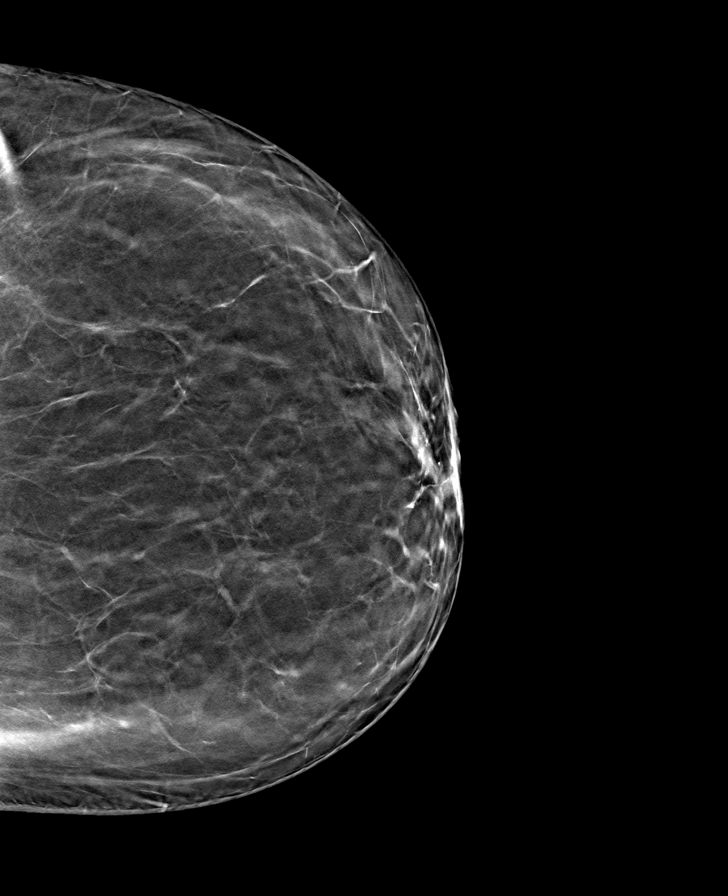

[R MLO tomo · tomo slice 44/87.0]
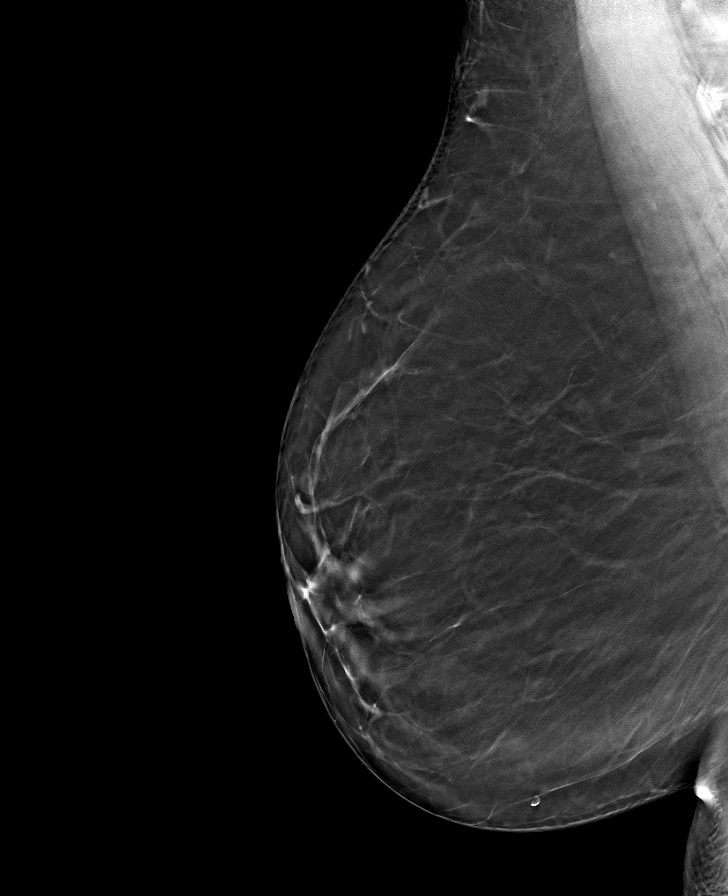

[L MLO tomo · tomo slice 44/87.0]
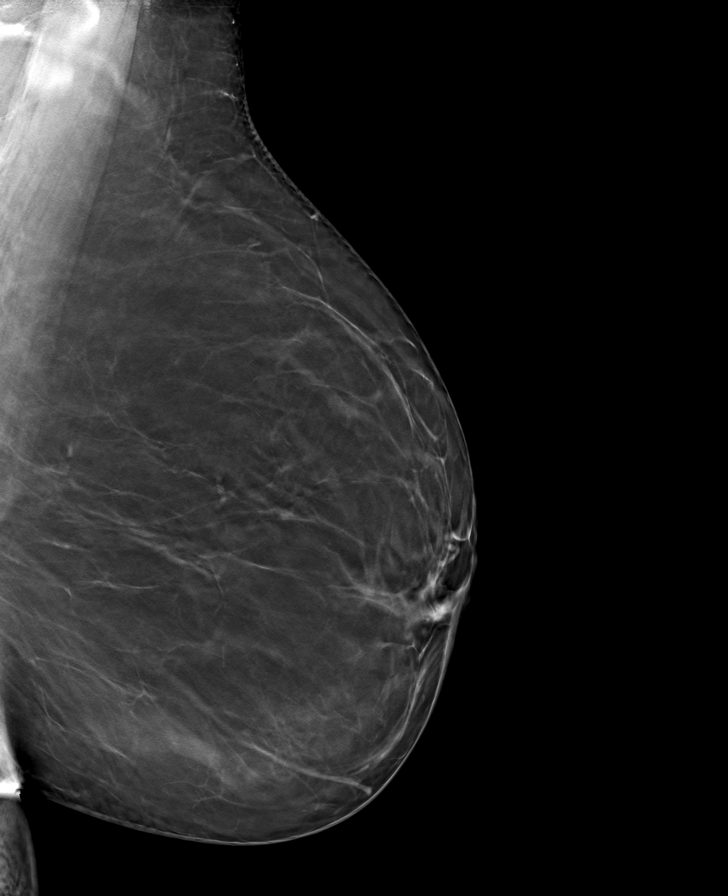

[8 of 24 positions shown; findings below may reference images not displayed]

ACR Breast Density Category b: There are scattered areas of
fibroglandular density.
FINDINGS: There are no findings suspicious for malignancy.
IMPRESSION: No mammographic evidence of malignancy. A result letter of this
screening mammogram will be mailed directly to the patient.

RECOMMENDATION:
Screening mammogram in one year. (Code:51-O-LD2)

BI-RADS CATEGORY  1: Negative.

## 2022-04-22 DIAGNOSIS — Z86010 Personal history of colonic polyps: Secondary | ICD-10-CM | POA: Diagnosis not present

## 2022-04-22 DIAGNOSIS — E669 Obesity, unspecified: Secondary | ICD-10-CM | POA: Diagnosis not present

## 2022-04-22 DIAGNOSIS — K760 Fatty (change of) liver, not elsewhere classified: Secondary | ICD-10-CM | POA: Diagnosis not present

## 2022-04-22 DIAGNOSIS — K580 Irritable bowel syndrome with diarrhea: Secondary | ICD-10-CM | POA: Diagnosis not present

## 2022-06-14 DIAGNOSIS — E119 Type 2 diabetes mellitus without complications: Secondary | ICD-10-CM | POA: Diagnosis not present

## 2022-06-14 DIAGNOSIS — E039 Hypothyroidism, unspecified: Secondary | ICD-10-CM | POA: Diagnosis not present

## 2022-06-14 DIAGNOSIS — I10 Essential (primary) hypertension: Secondary | ICD-10-CM | POA: Diagnosis not present

## 2022-06-14 DIAGNOSIS — E7800 Pure hypercholesterolemia, unspecified: Secondary | ICD-10-CM | POA: Diagnosis not present

## 2022-06-22 DIAGNOSIS — E119 Type 2 diabetes mellitus without complications: Secondary | ICD-10-CM | POA: Diagnosis not present

## 2022-06-22 DIAGNOSIS — E7800 Pure hypercholesterolemia, unspecified: Secondary | ICD-10-CM | POA: Diagnosis not present

## 2022-06-22 DIAGNOSIS — E039 Hypothyroidism, unspecified: Secondary | ICD-10-CM | POA: Diagnosis not present

## 2022-06-22 DIAGNOSIS — I10 Essential (primary) hypertension: Secondary | ICD-10-CM | POA: Diagnosis not present

## 2022-06-29 DIAGNOSIS — H3581 Retinal edema: Secondary | ICD-10-CM | POA: Diagnosis not present

## 2022-10-21 ENCOUNTER — Other Ambulatory Visit: Payer: Self-pay | Admitting: Family Medicine

## 2022-10-21 DIAGNOSIS — Z1231 Encounter for screening mammogram for malignant neoplasm of breast: Secondary | ICD-10-CM

## 2023-05-10 ENCOUNTER — Ambulatory Visit: Payer: BC Managed Care – PPO

## 2023-05-24 ENCOUNTER — Ambulatory Visit
Admission: RE | Admit: 2023-05-24 | Discharge: 2023-05-24 | Disposition: A | Discharge status: Home / Self Care | Payer: No Typology Code available for payment source | Source: Ambulatory Visit | Attending: Family Medicine | Admitting: Family Medicine

## 2023-05-24 DIAGNOSIS — Z1231 Encounter for screening mammogram for malignant neoplasm of breast: Secondary | ICD-10-CM

## 2024-04-19 ENCOUNTER — Ambulatory Visit: Payer: Self-pay

## 2024-06-01 ENCOUNTER — Other Ambulatory Visit: Payer: Self-pay | Admitting: Family Medicine

## 2024-06-01 DIAGNOSIS — Z1231 Encounter for screening mammogram for malignant neoplasm of breast: Secondary | ICD-10-CM

## 2024-06-15 ENCOUNTER — Ambulatory Visit
Admission: RE | Admit: 2024-06-15 | Discharge: 2024-06-15 | Disposition: A | Discharge status: Home / Self Care | Source: Ambulatory Visit | Attending: Family Medicine | Admitting: Family Medicine

## 2024-06-15 DIAGNOSIS — Z1231 Encounter for screening mammogram for malignant neoplasm of breast: Secondary | ICD-10-CM

## 2024-08-21 ENCOUNTER — Other Ambulatory Visit: Payer: Self-pay | Admitting: Gastroenterology

## 2024-08-21 DIAGNOSIS — K760 Fatty (change of) liver, not elsewhere classified: Secondary | ICD-10-CM

## 2024-10-16 ENCOUNTER — Other Ambulatory Visit
